# Patient Record
Sex: Male | Born: 1955 | Hispanic: Yes | Marital: Married | Smoking: Never smoker
Health system: Southern US, Community
[De-identification: ages and names within clinical notes are randomized; demographics above are authoritative.]

## PROBLEM LIST (undated history)

## (undated) DIAGNOSIS — Z87442 Personal history of urinary calculi: Secondary | ICD-10-CM

## (undated) DIAGNOSIS — I10 Essential (primary) hypertension: Secondary | ICD-10-CM

## (undated) DIAGNOSIS — Z8489 Family history of other specified conditions: Secondary | ICD-10-CM

## (undated) DIAGNOSIS — R7303 Prediabetes: Secondary | ICD-10-CM

---

## 2013-10-31 ENCOUNTER — Emergency Department: Payer: Self-pay | Admitting: Emergency Medicine

## 2013-10-31 LAB — COMPREHENSIVE METABOLIC PANEL
ALBUMIN: 4.1 g/dL (ref 3.4–5.0)
ALK PHOS: 69 U/L
Anion Gap: 4 — ABNORMAL LOW (ref 7–16)
BUN: 19 mg/dL — AB (ref 7–18)
Bilirubin,Total: 0.7 mg/dL (ref 0.2–1.0)
CHLORIDE: 107 mmol/L (ref 98–107)
Calcium, Total: 8.7 mg/dL (ref 8.5–10.1)
Co2: 26 mmol/L (ref 21–32)
Creatinine: 1.17 mg/dL (ref 0.60–1.30)
EGFR (African American): >60
Glucose: 139 mg/dL — ABNORMAL HIGH (ref 65–99)
Osmolality: 278 (ref 275–301)
Potassium: 4 mmol/L (ref 3.5–5.1)
SGOT(AST): 40 U/L — ABNORMAL HIGH (ref 15–37)
SGPT (ALT): 51 U/L (ref 12–78)
Sodium: 137 mmol/L (ref 136–145)
TOTAL PROTEIN: 7.9 g/dL (ref 6.4–8.2)

## 2013-10-31 LAB — URINALYSIS, COMPLETE
BACTERIA: NONE SEEN
BILIRUBIN, UR: NEGATIVE
BLOOD: NEGATIVE
Glucose,UR: NEGATIVE mg/dL (ref 0–75)
Ketone: NEGATIVE
Leukocyte Esterase: NEGATIVE
Nitrite: NEGATIVE
Ph: 5 (ref 4.5–8.0)
Protein: 30
RBC,UR: 5 /HPF (ref 0–5)
SPECIFIC GRAVITY: 1.029 (ref 1.003–1.030)
WBC UR: 6 /HPF (ref 0–5)

## 2013-10-31 LAB — CBC WITH DIFFERENTIAL/PLATELET
BASOS PCT: 2.1 %
Basophil #: 0.2 10*3/uL — ABNORMAL HIGH (ref 0.0–0.1)
EOS PCT: 0.4 %
Eosinophil #: 0 10*3/uL (ref 0.0–0.7)
HCT: 47 % (ref 40.0–52.0)
HGB: 15.1 g/dL (ref 13.0–18.0)
LYMPHS ABS: 1.9 10*3/uL (ref 1.0–3.6)
Lymphocyte %: 18.2 %
MCH: 29.1 pg (ref 26.0–34.0)
MCHC: 32.2 g/dL (ref 32.0–36.0)
MCV: 90 fL (ref 80–100)
Monocyte #: 0.4 x10 3/mm (ref 0.2–1.0)
Monocyte %: 3.4 %
NEUTROS PCT: 75.9 %
Neutrophil #: 7.8 10*3/uL — ABNORMAL HIGH (ref 1.4–6.5)
Platelet: 199 10*3/uL (ref 150–440)
RBC: 5.2 10*6/uL (ref 4.40–5.90)
RDW: 13.2 % (ref 11.5–14.5)
WBC: 10.3 10*3/uL (ref 3.8–10.6)

## 2017-04-03 ENCOUNTER — Ambulatory Visit: Payer: Self-pay | Admitting: General Surgery

## 2017-04-03 NOTE — H&P (Signed)
PATIENT PROFILE: Louis Reyes is a 61 y.o. male who presents to the Clinic for consultation at the request of Dr. Netty Starring for evaluation of unilateral inguinal hernia.  PCP:  Dion Body, MD  HISTORY OF PRESENT ILLNESS: Mr. Louis Reyes reports feeling the inguinal hernia since 2 years ago. Patient refers did not bother that much until the last few months. Patient refers it has increased in size. Refers standing for prolonged hours at work and doing heavy lifting make the pain worse. Advil does not improve pain much. Denies abdominal distention. Refers having regular bowel movement without the need of strain. Denies nausea or vomiting.    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) - diet controlled 08/25/2016      GENERAL REVIEW OF SYSTEMS:   General ROS: negative for - chills, fatigue, fever, weight gain or weight loss Allergy and Immunology ROS: negative for - hives  Hematological and Lymphatic ROS: negative for - bleeding problems or bruising, negative for palpable nodes Endocrine ROS: negative for - heat or cold intolerance, hair changes Respiratory ROS: negative for - cough, shortness of breath or wheezing Cardiovascular ROS: no chest pain or palpitations GI ROS: negative for nausea, vomiting, abdominal pain, diarrhea, constipation Musculoskeletal ROS: negative for - joint swelling or muscle pain Neurological ROS: negative for - confusion, syncope Dermatological ROS: negative for pruritus and rash  MEDICATIONS: CurrentMedications        Current Outpatient Prescriptions  Medication Sig Dispense Refill  . losartan (COZAAR) 50 MG tablet Take 1 tablet (50 mg total) by mouth once daily. 30 tablet 3   No current facility-administered medications for this visit.       ALLERGIES: Patient has no  known allergies.  PAST MEDICAL HISTORY: History reviewed. No pertinent past medical history.  PAST SURGICAL HISTORY: History reviewed. No pertinent surgical history.   FAMILY HISTORY:      Family History  Problem Relation Age of Onset  . No Known Problems Mother   . No Known Problems Father   . Diabetes type II Sister      SOCIAL HISTORY: Social History        Social History  . Marital status: Married    Spouse name: N/A  . Number of children: N/A  . Years of education: N/A       Social History Main Topics  . Smoking status: Never Smoker  . Smokeless tobacco: Never Used  . Alcohol use No  . Drug use: No  . Sexual activity: Defer       Other Topics Concern  . 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 Weight: 75.8 kg (167 lb)   General Appearance:    Alert, cooperative, no distress, appears stated age  Head:     Atraumatic, normocephalic  Eyes:   Anciteric, no erythema, no secretions  Neck:   Supple, symmetrical, no JVD, no palpable lymph nodes  Mouth:   Lips, mucosa, and  tongue normal;   Lungs:     Clear to auscultation bilaterally, respirations unlabored   Heart:    Regular rate and rhythm, S1 and S2 normal, no murmur, rub   or gallop  Abdomen:     Soft, non-tender, bowel sounds active all four quadrants,    no masses, no organomegaly. Right inguinal hernia, reducible. Mild tender to palpation. No abdominal distention.   Extremities:   Extremities normal, atraumatic, no cyanosis or edema  Skin:   Skin color, texture, turgor normal, no rashes or lesions   Neurologic:   Grossly intact.    REVIEW OF DATA: I have reviewed the following data today:      Appointment on  04/01/2017  Component Date Value  . Hemoglobin A1C 04/01/2017 5.9*  . Average Blood Glucose (C* 04/01/2017 123      ASSESSMENT: Mr. Louis Reyes is a 61 y.o. male presenting for consultation for inguinal hernia. Patient with right inguinal hernia that is easily reducible on physical exam. Patient currently started on BP medication due to HTN. At this visit still uncontrolled.  We oriented patient about the diagnosis of inguinal hernia, what does it implicate, how to manage it, treatment alternatives (observaton vs surgery). Patient elected surgical management which I consider will benefit this patient with symptoms of pain and to reduce the risk of incarceration and/or obstruction. Patient will need to see PCP again for medical clearance and better BP control for surgery.    PLAN: 1. Return to Primary Physician for medical clearance as BP still elevated 2. Avoid heavy lifting 3. Inguinal Hernia Repair with mesh CPT: 37106 4. Follow Clinic Staff instructions for pre admission process 5. CBC, CMP, EKG, Internal Medicine clearance.   Patient and wife verbalized understanding, all questions were answered, and were agreeable with the plan outlined above.   Herbert Pun, MD  Electronically signed by Herbert Pun, MD

## 2017-04-12 ENCOUNTER — Ambulatory Visit: Payer: Self-pay | Admitting: General Surgery

## 2017-04-12 NOTE — H&P (Signed)
PATIENT PROFILE: Markese Bloxham is a 61 y.o. male who presents to the Clinic for consultation at the request of Dr. Netty Starring for evaluation of unilateral inguinal hernia.  PCP:  Dion Body, MD  HISTORY OF PRESENT ILLNESS: [name] reports feeling the inguinal hernia since 2 years ago. Patient refers did not bother that much until the last few months. Patient refers it has increased in size. Refers standing for prolonged hours at work and doing heavy lifting make the pain worse. Advil does not improve pain much. Denies abdominal distention. Refers having regular bowel movement without the need of strain. Denies nausea or vomiting.    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) - diet controlled 08/25/2016      GENERAL REVIEW OF SYSTEMS:   General ROS: negative for - chills, fatigue, fever, weight gain or weight loss Allergy and Immunology ROS: negative for - hives  Hematological and Lymphatic ROS: negative for - bleeding problems or bruising, negative for palpable nodes Endocrine ROS: negative for - heat or cold intolerance, hair changes Respiratory ROS: negative for - cough, shortness of breath or wheezing Cardiovascular ROS: no chest pain or palpitations GI ROS: negative for nausea, vomiting, abdominal pain, diarrhea, constipation Musculoskeletal ROS: negative for - joint swelling or muscle pain Neurological ROS: negative for - confusion, syncope Dermatological ROS: negative for pruritus and rash  MEDICATIONS: CurrentMedications        Current Outpatient Prescriptions  Medication Sig Dispense Refill  . losartan (COZAAR) 50 MG tablet Take 1 tablet (50 mg total) by mouth once daily. 30 tablet 3   No current facility-administered medications for this visit.       ALLERGIES: Patient has no  known allergies.  PAST MEDICAL HISTORY: History reviewed. No pertinent past medical history.  PAST SURGICAL HISTORY: History reviewed. No pertinent surgical history.   FAMILY HISTORY:      Family History  Problem Relation Age of Onset  . No Known Problems Mother   . No Known Problems Father   . Diabetes type II Sister      SOCIAL HISTORY: Social History        Social History  . Marital status: Married    Spouse name: N/A  . Number of children: N/A  . Years of education: N/A       Social History Main Topics  . Smoking status: Never Smoker  . Smokeless tobacco: Never Used  . Alcohol use No  . Drug use: No  . Sexual activity: Defer       Other Topics Concern  . 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 Weight: 75.8 kg (167 lb)   General Appearance:    Alert, cooperative, no distress, appears stated age  Head:     Atraumatic, normocephalic  Eyes:   Anciteric, no erythema, no secretions  Neck:   Supple, symmetrical, no JVD, no palpable lymph nodes  Mouth:  Lips, mucosa, and tongue normal;   Lungs:     Clear to auscultation bilaterally, respirations unlabored   Heart:    Regular rate and rhythm, S1 and S2 normal, no murmur, rub   or gallop  Abdomen:     Soft, non-tender, bowel sounds active all four quadrants,    no masses, no organomegaly. Right inguinal hernia, reducible. Mild tender to palpation. No abdominal distention.   Extremities:   Extremities normal, atraumatic, no cyanosis or edema  Skin:   Skin color, texture, turgor normal, no rashes or lesions   Neurologic:   Grossly intact.    REVIEW OF DATA: I have reviewed the following data today:      Appointment on  04/01/2017  Component Date Value  . Hemoglobin A1C 04/01/2017 5.9*  . Average Blood Glucose (C* 04/01/2017 123      ASSESSMENT: [name] is a 61 y.o. male presenting for consultation for inguinal hernia. Patient with right inguinal hernia that is easily reducible on physical exam. Patient currently started on BP medication due to HTN. At this visit still uncontrolled.  We oriented patient about the diagnosis of inguinal hernia, what does it implicate, how to manage it, treatment alternatives (observaton vs surgery). Patient elected surgical management which I consider will benefit this patient with symptoms of pain and to reduce the risk of incarceration and/or obstruction. Patient will need to see PCP again for medical clearance and better BP control for surgery.    PLAN: 1. Return to Primary Physician for medical clearance as BP still elevated 2. Avoid heavy lifting 3. Inguinal Hernia Repair with mesh CPT: 30865 4. Follow Clinic Staff instructions for pre admission process 5. CBC, CMP, EKG, Internal Medicine clearance.   Patient and wife verbalized understanding, all questions were answered, and were agreeable with the plan outlined above.   Herbert Pun, MD  Electronically signed by Herbert Pun, MD

## 2017-04-19 ENCOUNTER — Inpatient Hospital Stay: Admission: RE | Admit: 2017-04-19 | Payer: Self-pay | Source: Ambulatory Visit

## 2017-04-25 ENCOUNTER — Encounter
Admission: RE | Admit: 2017-04-25 | Discharge: 2017-04-25 | Disposition: A | Discharge status: Home / Self Care | Payer: BLUE CROSS/BLUE SHIELD | Source: Ambulatory Visit | Attending: General Surgery | Admitting: General Surgery

## 2017-04-25 ENCOUNTER — Ambulatory Visit: Payer: Self-pay | Admitting: General Surgery

## 2017-04-25 DIAGNOSIS — Z01818 Encounter for other preprocedural examination: Secondary | ICD-10-CM | POA: Diagnosis not present

## 2017-04-25 DIAGNOSIS — I10 Essential (primary) hypertension: Secondary | ICD-10-CM | POA: Diagnosis not present

## 2017-04-25 DIAGNOSIS — K4090 Unilateral inguinal hernia, without obstruction or gangrene, not specified as recurrent: Secondary | ICD-10-CM | POA: Insufficient documentation

## 2017-04-25 HISTORY — DX: Essential (primary) hypertension: I10

## 2017-04-25 HISTORY — DX: Family history of other specified conditions: Z84.89

## 2017-04-25 HISTORY — DX: Personal history of urinary calculi: Z87.442

## 2017-04-25 NOTE — Patient Instructions (Signed)
Your procedure is scheduled on: Monday Nov. 5, 2018 Su procedimiento est programado para: Report to Day Surgery. [facility] 2nd Floor Surgery Desk  Presntese a: To find out your arrival time please call [phone] between 1PM - 3PM on Friday Nov.2, 2018. Para saber su hora de llegada por favor llame al [phone] Louis Reyes la 1PM - 3PM el da: Call interpreter  Office Friday Nov 2. [phone]  Remember: Instructions that are not followed completely may result in serious medical risk, up to and including death, or upon the discretion of your surgeon and anesthesiologist your surgery may need to be rescheduled.  Recuerde: Las instrucciones que no se siguen completamente Heritage manager en un riesgo de salud grave, incluyendo hasta la [place] o a discrecin de su cirujano y Environmental health practitioner, su ciruga se puede posponer.   __X_ 1.Do not eat food after midnight the night before your procedure. No gum chewing or hard candies. You may drink clear liquids up to 2 hours before you are scheduled to arrive for your surgery- DO not drink clear liquids within 2 hours of the start of your surgery.  Clear Liquids include: water, apple juice without pulp, clear carbohydrate drink such as Clearfast of [place], Black Coffee or Tea (Do not add anything to coffee or tea).      No coma nada despus de la medianoche de la noche anterior a suprocedimiento. No coma chicles ni caramelos duros. Puede tomar lquidos claros hasta 2 horas antes de su hora programada de llegada al hospital para su procedimiento. No tome lquidos claros durante el transcurso de las 2 horas de su llegada programada al hospital para su procedimiento, ya que esto puede llevar a que su procedimiento seretrase o tenga que volver a Health and safety inspector.  Los lquidos claros incluyen:         - Agua o jugo de [place] sin pulpa         - Bebidas claras con carbohidratos como ClearFast o Gatorade         - Caf negro o t claro (sin leche, sin cremas, no  agregue nada al caf ni al  t)  No tome nada que no est en esta lista.  Los pacientes con diabetes tipo 1 y tipo 2 solo deben Agricultural engineer.  Llame a la clnica de PreCare o a la unidad de Same Day Surgery si  tiene alguna pregunta sobre estas instrucciones.              _X__ 2.Do Not Smoke or use e-cigarettes For 24 Hours Prior to Your  Surgery.  Do not use any chewable tobacco products for at least 6 hours prior to surgery. No fume ni use cigarrillos electrnicos durante las 24 horas previas a su [place].  No use ningn producto de tabaco masticable duranteal menos 6 horas antes de la [place].     __X_ 3. No alcohol for 24 hours before or after surgery.    No tome alcohol durante las 24 horas antes ni despus de la [place].   ____4. Bring all medications with you on the day of surgery if instructed.    Lleve todos los medicamentos con usted el da de su ciruga si se le   ha indicado as.   _X___ 5. Notify your doctor if there is any change in your medical condition (cold, fever, infections).    Informe a su mdico si hay algn cambio en su condicin mdica (resfriado, fiebre, infecciones).   Do not wear jewelry,  make-up, hairpins, clips or nail polish.  No use joyas, maquillajes, pinzas/ganchos para el cabello ni esmalte de uas.  Do not wear lotions, powders, or perfumes.   No use lociones, polvos o perfumes. .    Do not shave 48 hours prior to surgery. Men may shave face and neck.  No se afeite 48 horas antes de la [place].  Los hombres pueden [employer] cara  y el cuello.   Do not bring valuables to the hospital.   No lleve objetos [facility] is not responsible for any belongings or valuables.  [facility] no se hace responsable de ningn tipo de pertenencias uobjetos de Geographical information systems officer.               Contacts, dentures or bridgework may not be worn into surgery.  Los lentes de [place], las dentaduras postizas o puentes no se pueden usar en la [place].  Leave your  suitcase in the car. After surgery it may be brought to your room.  Deje su maleta en el auto.  Despus de la ciruga podr traerla a su habitacin.  For patients admitted to the hospital, discharge time is determined by your treatment team.  Para los pacientes que sean ingresados al hospital, el tiempo en el cual se le dar de alta es determinado por su equipo de [place].   Patients discharged the day of surgery will not be allowed to drive home. A los pacientes que se les da de alta el mismo da de la ciruga no se les permitir conducir a Holiday representative.   Please read over the following fact sheets that you were given: Por favor [place] informacin que le dieron:      ____ Take these medicines the morning of surgery with A SIP OF WATER:          [employer] estas medicinas la maana de la ciruga con [place]:  1.   2.   3.               ____ Fleet Enema (as directed)          Enema de Fleet (segn lo indicado)    ___X_ Use CHG Soap as directed          Utilice el jabn de CHG segn lo indicado  ____ Use inhalers on the day of surgery          Use los inhaladores el da de la ciruga  ____ Stop metformin 2 days prior to surgery          Deje de tomar el metformin 2 das antes de la ciruga    ____ Take 1/2 of usual insulin dose the night before surgery and none on the morning of surgery           Tome la mitad de la dosis habitual de insulina la noche antes de la [place] y no tome nada en la maana de la             ciruga  ____ Stop Coumadin/Plavix/aspirin on ( NO ASPIRIN or ASPIRIN PRODUCTS)          Deje de tomar el Coumadin/Plavix/aspirina el da:  ____ Stop Anti-inflammatories on ( NO ASPIRIN PRODUCTS)          Deje de tomar antiinflamatorios el da:   ____ Stop supplements until after surgery            Deje de tomar suplementos  hasta despus de la ciruga  ____ Bring C-Pap to the hospital          [place] al hospital

## 2017-04-25 NOTE — H&P (View-Only) (Signed)
PATIENT PROFILE: Fabricio Endsley is a 61 y.o. male who presents to the Clinic for consultation at the request of Dr. Netty Starring for evaluation of unilateral inguinal hernia.  PCP:  Dion Body, MD  HISTORY OF PRESENT ILLNESS: [name] reports feeling the inguinal hernia since 2 years ago. Patient refers did not bother that much until the last few months. Patient refers it has increased in size. Refers standing for prolonged hours at work and doing heavy lifting make the pain worse. Advil does not improve pain much. Denies abdominal distention. Refers having regular bowel movement without the need of strain. Denies nausea or vomiting.    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) - diet controlled 08/25/2016      GENERAL REVIEW OF SYSTEMS:   General ROS: negative for - chills, fatigue, fever, weight gain or weight loss Allergy and Immunology ROS: negative for - hives  Hematological and Lymphatic ROS: negative for - bleeding problems or bruising, negative for palpable nodes Endocrine ROS: negative for - heat or cold intolerance, hair changes Respiratory ROS: negative for - cough, shortness of breath or wheezing Cardiovascular ROS: no chest pain or palpitations GI ROS: negative for nausea, vomiting, abdominal pain, diarrhea, constipation Musculoskeletal ROS: negative for - joint swelling or muscle pain Neurological ROS: negative for - confusion, syncope Dermatological ROS: negative for pruritus and rash  MEDICATIONS: CurrentMedications        Current Outpatient Prescriptions  Medication Sig Dispense Refill  . losartan (COZAAR) 50 MG tablet Take 1 tablet (50 mg total) by mouth once daily. 30 tablet 3   No current facility-administered medications for this visit.       ALLERGIES: Patient has no  known allergies.  PAST MEDICAL HISTORY: History reviewed. No pertinent past medical history.  PAST SURGICAL HISTORY: History reviewed. No pertinent surgical history.   FAMILY HISTORY:      Family History  Problem Relation Age of Onset  . No Known Problems Mother   . No Known Problems Father   . Diabetes type II Sister      SOCIAL HISTORY: Social History        Social History  . Marital status: Married    Spouse name: N/A  . Number of children: N/A  . Years of education: N/A       Social History Main Topics  . Smoking status: Never Smoker  . Smokeless tobacco: Never Used  . Alcohol use No  . Drug use: No  . Sexual activity: Defer       Other Topics Concern  . 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 Weight: 75.8 kg (167 lb)   General Appearance:    Alert, cooperative, no distress, appears stated age  Head:     Atraumatic, normocephalic  Eyes:   Anciteric, no erythema, no secretions  Neck:   Supple, symmetrical, no JVD, no palpable lymph nodes  Mouth:  Lips, mucosa, and tongue normal;   Lungs:     Clear to auscultation bilaterally, respirations unlabored   Heart:    Regular rate and rhythm, S1 and S2 normal, no murmur, rub   or gallop  Abdomen:     Soft, non-tender, bowel sounds active all four quadrants,    no masses, no organomegaly. Right inguinal hernia, reducible. Mild tender to palpation. No abdominal distention.   Extremities:   Extremities normal, atraumatic, no cyanosis or edema  Skin:   Skin color, texture, turgor normal, no rashes or lesions   Neurologic:   Grossly intact.    REVIEW OF DATA: I have reviewed the following data today:      Appointment on  04/01/2017  Component Date Value  . Hemoglobin A1C 04/01/2017 5.9*  . Average Blood Glucose (C* 04/01/2017 123      ASSESSMENT: [name] is a 61 y.o. male presenting for consultation for inguinal hernia. Patient with right inguinal hernia that is easily reducible on physical exam. Patient was oriented about the diagnosis of inguinal hernia, what does it implicate, how to manage it, treatment alternatives (observaton vs surgery). Patient elected surgical management which I consider will benefit this patient with symptoms of pain and to reduce the risk of incarceration and/or obstruction.    PLAN: 1. Avoid heavy lifting 2. Inguinal Hernia Repair with mesh CPT: 03009 3. Follow Clinic Staff instructions for pre admission process 4. CBC, CMP, EKG, Internal Medicine clearance.   Patient and wife verbalized understanding, all questions were answered, and were agreeable with the plan outlined above.   Herbert Pun, MD  Electronically signed by Herbert Pun, MD

## 2017-04-25 NOTE — H&P (Signed)
PATIENT PROFILE: Louis Reyes is a 61 y.o. male who presents to the Clinic for consultation at the request of Dr. Netty Starring for evaluation of unilateral inguinal hernia.  PCP:  Dion Body, MD  HISTORY OF PRESENT ILLNESS: Mr. Louis Reyes reports feeling the inguinal hernia since 2 years ago. Patient refers did not bother that much until the last few months. Patient refers it has increased in size. Refers standing for prolonged hours at work and doing heavy lifting make the pain worse. Advil does not improve pain much. Denies abdominal distention. Refers having regular bowel movement without the need of strain. Denies nausea or vomiting.    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) - diet controlled 08/25/2016      GENERAL REVIEW OF SYSTEMS:   General ROS: negative for - chills, fatigue, fever, weight gain or weight loss Allergy and Immunology ROS: negative for - hives  Hematological and Lymphatic ROS: negative for - bleeding problems or bruising, negative for palpable nodes Endocrine ROS: negative for - heat or cold intolerance, hair changes Respiratory ROS: negative for - cough, shortness of breath or wheezing Cardiovascular ROS: no chest pain or palpitations GI ROS: negative for nausea, vomiting, abdominal pain, diarrhea, constipation Musculoskeletal ROS: negative for - joint swelling or muscle pain Neurological ROS: negative for - confusion, syncope Dermatological ROS: negative for pruritus and rash  MEDICATIONS: CurrentMedications        Current Outpatient Prescriptions  Medication Sig Dispense Refill  . losartan (COZAAR) 50 MG tablet Take 1 tablet (50 mg total) by mouth once daily. 30 tablet 3   No current facility-administered medications for this visit.       ALLERGIES: Patient has no  known allergies.  PAST MEDICAL HISTORY: History reviewed. No pertinent past medical history.  PAST SURGICAL HISTORY: History reviewed. No pertinent surgical history.   FAMILY HISTORY:      Family History  Problem Relation Age of Onset  . No Known Problems Mother   . No Known Problems Father   . Diabetes type II Sister      SOCIAL HISTORY: Social History        Social History  . Marital status: Married    Spouse name: N/A  . Number of children: N/A  . Years of education: N/A       Social History Main Topics  . Smoking status: Never Smoker  . Smokeless tobacco: Never Used  . Alcohol use No  . Drug use: No  . Sexual activity: Defer       Other Topics Concern  . 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 Weight: 75.8 kg (167 lb)   General Appearance:    Alert, cooperative, no distress, appears stated age  Head:     Atraumatic, normocephalic  Eyes:   Anciteric, no erythema, no secretions  Neck:   Supple, symmetrical, no JVD, no palpable lymph nodes  Mouth:  Lips, mucosa, and tongue normal;   Lungs:     Clear to auscultation bilaterally, respirations unlabored   Heart:    Regular rate and rhythm, S1 and S2 normal, no murmur, rub   or gallop  Abdomen:     Soft, non-tender, bowel sounds active all four quadrants,    no masses, no organomegaly. Right inguinal hernia, reducible. Mild tender to palpation. No abdominal distention.   Extremities:   Extremities normal, atraumatic, no cyanosis or edema  Skin:   Skin color, texture, turgor normal, no rashes or lesions   Neurologic:   Grossly intact.    REVIEW OF DATA: I have reviewed the following data today:      Appointment on  04/01/2017  Component Date Value  . Hemoglobin A1C 04/01/2017 5.9*  . Average Blood Glucose (C* 04/01/2017 123      ASSESSMENT: Mr. Louis Reyes is a 61 y.o. male presenting for consultation for inguinal hernia. Patient with right inguinal hernia that is easily reducible on physical exam. Patient was oriented about the diagnosis of inguinal hernia, what does it implicate, how to manage it, treatment alternatives (observaton vs surgery). Patient elected surgical management which I consider will benefit this patient with symptoms of pain and to reduce the risk of incarceration and/or obstruction.    PLAN: 1. Avoid heavy lifting 2. Inguinal Hernia Repair with mesh CPT: 50539 3. Follow Clinic Staff instructions for pre admission process 4. CBC, CMP, EKG, Internal Medicine clearance.   Patient and wife verbalized understanding, all questions were answered, and were agreeable with the plan outlined above.   Herbert Pun, MD  Electronically signed by Herbert Pun, MD

## 2017-04-28 MED ORDER — CEFAZOLIN SODIUM-DEXTROSE 2-4 GM/100ML-% IV SOLN
2.0000 g | INTRAVENOUS | Status: AC
  Administered 2017-04-29: 2 g via INTRAVENOUS

## 2017-04-29 ENCOUNTER — Ambulatory Visit: Payer: BLUE CROSS/BLUE SHIELD | Admitting: Certified Registered Nurse Anesthetist

## 2017-04-29 ENCOUNTER — Encounter: Payer: Self-pay | Admitting: Anesthesiology

## 2017-04-29 ENCOUNTER — Other Ambulatory Visit: Payer: Self-pay

## 2017-04-29 ENCOUNTER — Encounter
Admission: RE | Disposition: A | Discharge status: Home / Self Care | Payer: Self-pay | Source: Ambulatory Visit | Attending: General Surgery

## 2017-04-29 ENCOUNTER — Ambulatory Visit
Admission: RE | Admit: 2017-04-29 | Discharge: 2017-04-29 | Disposition: A | Discharge status: Home / Self Care | Payer: BLUE CROSS/BLUE SHIELD | Source: Ambulatory Visit | Attending: General Surgery | Admitting: General Surgery

## 2017-04-29 DIAGNOSIS — K4090 Unilateral inguinal hernia, without obstruction or gangrene, not specified as recurrent: Secondary | ICD-10-CM | POA: Diagnosis present

## 2017-04-29 DIAGNOSIS — I10 Essential (primary) hypertension: Secondary | ICD-10-CM | POA: Diagnosis not present

## 2017-04-29 DIAGNOSIS — D176 Benign lipomatous neoplasm of spermatic cord: Secondary | ICD-10-CM | POA: Insufficient documentation

## 2017-04-29 DIAGNOSIS — K4030 Unilateral inguinal hernia, with obstruction, without gangrene, not specified as recurrent: Secondary | ICD-10-CM | POA: Diagnosis not present

## 2017-04-29 HISTORY — PX: HERNIA REPAIR: SHX51

## 2017-04-29 HISTORY — PX: INGUINAL HERNIA REPAIR: SHX194

## 2017-04-29 SURGERY — REPAIR, HERNIA, INGUINAL, ADULT
Anesthesia: General

## 2017-04-29 MED ORDER — HYDROCODONE-ACETAMINOPHEN 7.5-325 MG PO TABS: 1.0000 | ORAL_TABLET | Freq: Four times a day (QID) | ORAL | Status: DC | PRN

## 2017-04-29 MED ORDER — BUPIVACAINE-EPINEPHRINE (PF) 0.25% -1:200000 IJ SOLN
INTRAMUSCULAR | Status: AC
Start: 2017-04-29 — End: ?
  Filled 2017-04-29: qty 20

## 2017-04-29 MED ORDER — FENTANYL CITRATE (PF) 100 MCG/2ML IJ SOLN
INTRAMUSCULAR | Status: AC
  Filled 2017-04-29: qty 2

## 2017-04-29 MED ORDER — PROPOFOL 10 MG/ML IV BOLUS
INTRAVENOUS | Status: AC
  Filled 2017-04-29: qty 20

## 2017-04-29 MED ORDER — FENTANYL CITRATE (PF) 100 MCG/2ML IJ SOLN
INTRAMUSCULAR | Status: DC | PRN
  Administered 2017-04-29 (×4): 50 ug via INTRAVENOUS

## 2017-04-29 MED ORDER — ONDANSETRON HCL 4 MG/2ML IJ SOLN: 4.0000 mg | Freq: Once | INTRAMUSCULAR | Status: DC | PRN

## 2017-04-29 MED ORDER — SUGAMMADEX SODIUM 200 MG/2ML IV SOLN
INTRAVENOUS | Status: AC
  Filled 2017-04-29: qty 2

## 2017-04-29 MED ORDER — CEFAZOLIN SODIUM-DEXTROSE 2-4 GM/100ML-% IV SOLN
INTRAVENOUS | Status: AC
  Filled 2017-04-29: qty 100

## 2017-04-29 MED ORDER — SUCCINYLCHOLINE CHLORIDE 20 MG/ML IJ SOLN
INTRAMUSCULAR | Status: AC
  Filled 2017-04-29: qty 1

## 2017-04-29 MED ORDER — GLYCOPYRROLATE 0.2 MG/ML IJ SOLN
INTRAMUSCULAR | Status: DC | PRN
  Administered 2017-04-29: 0.2 mg via INTRAVENOUS

## 2017-04-29 MED ORDER — ROCURONIUM BROMIDE 50 MG/5ML IV SOLN
INTRAVENOUS | Status: AC
  Filled 2017-04-29: qty 1

## 2017-04-29 MED ORDER — DEXAMETHASONE SODIUM PHOSPHATE 10 MG/ML IJ SOLN
INTRAMUSCULAR | Status: AC
  Filled 2017-04-29: qty 1

## 2017-04-29 MED ORDER — ONDANSETRON HCL 4 MG/2ML IJ SOLN
INTRAMUSCULAR | Status: AC
  Filled 2017-04-29: qty 2

## 2017-04-29 MED ORDER — FAMOTIDINE 20 MG PO TABS
ORAL_TABLET | ORAL | Status: AC
  Administered 2017-04-29: 20 mg via ORAL
  Filled 2017-04-29: qty 1

## 2017-04-29 MED ORDER — FAMOTIDINE 20 MG PO TABS
20.0000 mg | ORAL_TABLET | Freq: Once | ORAL | Status: AC
  Administered 2017-04-29: 20 mg via ORAL

## 2017-04-29 MED ORDER — LACTATED RINGERS IV SOLN
INTRAVENOUS | Status: DC
  Administered 2017-04-29: 11:00:00 via INTRAVENOUS

## 2017-04-29 MED ORDER — SUGAMMADEX SODIUM 200 MG/2ML IV SOLN
INTRAVENOUS | Status: DC | PRN
  Administered 2017-04-29: 150 mg via INTRAVENOUS

## 2017-04-29 MED ORDER — ACETAMINOPHEN 10 MG/ML IV SOLN
INTRAVENOUS | Status: AC
  Filled 2017-04-29: qty 100

## 2017-04-29 MED ORDER — PROPOFOL 10 MG/ML IV BOLUS
INTRAVENOUS | Status: DC | PRN
  Administered 2017-04-29: 150 mg via INTRAVENOUS
  Administered 2017-04-29: 30 mg via INTRAVENOUS
  Administered 2017-04-29: 20 mg via INTRAVENOUS

## 2017-04-29 MED ORDER — FENTANYL CITRATE (PF) 100 MCG/2ML IJ SOLN
25.0000 ug | INTRAMUSCULAR | Status: DC | PRN
  Administered 2017-04-29 (×4): 25 ug via INTRAVENOUS

## 2017-04-29 MED ORDER — DEXAMETHASONE SODIUM PHOSPHATE 10 MG/ML IJ SOLN
INTRAMUSCULAR | Status: DC | PRN
  Administered 2017-04-29: 10 mg via INTRAVENOUS

## 2017-04-29 MED ORDER — MIDAZOLAM HCL 2 MG/2ML IJ SOLN
INTRAMUSCULAR | Status: DC | PRN
  Administered 2017-04-29: 2 mg via INTRAVENOUS

## 2017-04-29 MED ORDER — FENTANYL CITRATE (PF) 100 MCG/2ML IJ SOLN
INTRAMUSCULAR | Status: AC
  Administered 2017-04-29: 25 ug via INTRAVENOUS
  Filled 2017-04-29: qty 2

## 2017-04-29 MED ORDER — SUCCINYLCHOLINE CHLORIDE 20 MG/ML IJ SOLN
INTRAMUSCULAR | Status: DC | PRN
  Administered 2017-04-29: 100 mg via INTRAVENOUS

## 2017-04-29 MED ORDER — PHENYLEPHRINE HCL 10 MG/ML IJ SOLN
INTRAMUSCULAR | Status: DC | PRN
  Administered 2017-04-29 (×2): 200 ug via INTRAVENOUS
  Administered 2017-04-29: 100 ug via INTRAVENOUS
  Administered 2017-04-29: 200 ug via INTRAVENOUS
  Administered 2017-04-29: 100 ug via INTRAVENOUS
  Administered 2017-04-29 (×2): 200 ug via INTRAVENOUS
  Administered 2017-04-29: 100 ug via INTRAVENOUS

## 2017-04-29 MED ORDER — KETOROLAC TROMETHAMINE 30 MG/ML IJ SOLN
INTRAMUSCULAR | Status: DC | PRN
  Administered 2017-04-29: 30 mg via INTRAVENOUS

## 2017-04-29 MED ORDER — ONDANSETRON HCL 4 MG/2ML IJ SOLN
INTRAMUSCULAR | Status: DC | PRN
  Administered 2017-04-29: 4 mg via INTRAVENOUS

## 2017-04-29 MED ORDER — MIDAZOLAM HCL 2 MG/2ML IJ SOLN
INTRAMUSCULAR | Status: AC
  Filled 2017-04-29: qty 2

## 2017-04-29 MED ORDER — ACETAMINOPHEN 10 MG/ML IV SOLN
INTRAVENOUS | Status: DC | PRN
  Administered 2017-04-29: 1000 mg via INTRAVENOUS

## 2017-04-29 MED ORDER — LIDOCAINE HCL (PF) 2 % IJ SOLN
INTRAMUSCULAR | Status: AC
  Filled 2017-04-29: qty 10

## 2017-04-29 MED ORDER — KETOROLAC TROMETHAMINE 30 MG/ML IJ SOLN
INTRAMUSCULAR | Status: AC
  Filled 2017-04-29: qty 1

## 2017-04-29 MED ORDER — BUPIVACAINE-EPINEPHRINE (PF) 0.25% -1:200000 IJ SOLN
INTRAMUSCULAR | Status: AC
Start: 2017-04-29 — End: ?
  Filled 2017-04-29: qty 10

## 2017-04-29 MED ORDER — ROCURONIUM BROMIDE 100 MG/10ML IV SOLN
INTRAVENOUS | Status: DC | PRN
  Administered 2017-04-29 (×3): 20 mg via INTRAVENOUS
  Administered 2017-04-29: 10 mg via INTRAVENOUS

## 2017-04-29 MED ORDER — BUPIVACAINE-EPINEPHRINE 0.25% -1:200000 IJ SOLN
INTRAMUSCULAR | Status: DC | PRN
  Administered 2017-04-29: 10 mL

## 2017-04-29 MED ORDER — TRAMADOL HCL 50 MG PO TABS: 50.0000 mg | ORAL_TABLET | Freq: Four times a day (QID) | ORAL | 0 refills | Status: AC | PRN

## 2017-04-29 SURGICAL SUPPLY — 38 items
ADHESIVE MASTISOL STRL (MISCELLANEOUS) IMPLANT
BLADE SURG 15 STRL LF DISP TIS (BLADE) ×1 IMPLANT
BLADE SURG 15 STRL SS (BLADE) ×2
CANISTER SUCT 1200ML W/VALVE (MISCELLANEOUS) ×3 IMPLANT
CHLORAPREP W/TINT 26ML (MISCELLANEOUS) ×3 IMPLANT
CLOSURE WOUND 1/2 X4 (GAUZE/BANDAGES/DRESSINGS)
DERMABOND ADVANCED (GAUZE/BANDAGES/DRESSINGS) ×2
DERMABOND ADVANCED .7 DNX12 (GAUZE/BANDAGES/DRESSINGS) ×1 IMPLANT
DRAIN PENROSE 1/4X12 LTX (DRAIN) ×3 IMPLANT
DRAPE LAPAROTOMY 100X77 ABD (DRAPES) ×3 IMPLANT
DRSG TELFA 3X8 NADH (GAUZE/BANDAGES/DRESSINGS) IMPLANT
ELECT REM PT RETURN 9FT ADLT (ELECTROSURGICAL) ×3
ELECTRODE REM PT RTRN 9FT ADLT (ELECTROSURGICAL) ×1 IMPLANT
GAUZE SPONGE 4X4 12PLY STRL (GAUZE/BANDAGES/DRESSINGS) IMPLANT
GLOVE BIO SURGEON STRL SZ8 (GLOVE) ×6 IMPLANT
GOWN STRL REUS W/ TWL LRG LVL3 (GOWN DISPOSABLE) ×2 IMPLANT
GOWN STRL REUS W/TWL LRG LVL3 (GOWN DISPOSABLE) ×4
LABEL OR SOLS (LABEL) IMPLANT
MESH HERNIA 4.5X10 SYS PRO LRG (Mesh General) ×1 IMPLANT
MESH HERNIA SYS PROLENE LG (Mesh General) ×2 IMPLANT
NDL SAFETY 22GX1.5 (NEEDLE) ×3 IMPLANT
NS IRRIG 500ML POUR BTL (IV SOLUTION) ×3 IMPLANT
PACK BASIN MINOR ARMC (MISCELLANEOUS) ×3 IMPLANT
SPONGE LAP 18X18 5 PK (GAUZE/BANDAGES/DRESSINGS) IMPLANT
STRIP CLOSURE SKIN 1/2X4 (GAUZE/BANDAGES/DRESSINGS) IMPLANT
SUT ETHIBOND 0 (SUTURE) ×6 IMPLANT
SUT ETHIBOND CT1 BRD #0 30IN (SUTURE) IMPLANT
SUT ETHIBOND NAB CT1 #1 30IN (SUTURE) IMPLANT
SUT MNCRL 4-0 (SUTURE) ×2
SUT MNCRL 4-0 27XMFL (SUTURE) ×1
SUT PROLENE 0 CT 1 30 (SUTURE) ×6 IMPLANT
SUT PROLENE 1 CT 1 30 (SUTURE) ×12 IMPLANT
SUT SURGILON 0 BLK (SUTURE) ×3 IMPLANT
SUT VIC AB 0 CT1 36 (SUTURE) ×6 IMPLANT
SUT VIC AB 3-0 SH 27 (SUTURE) ×2
SUT VIC AB 3-0 SH 27X BRD (SUTURE) ×1 IMPLANT
SUTURE MNCRL 4-0 27XMF (SUTURE) ×1 IMPLANT
SYRINGE 10CC LL (SYRINGE) ×3 IMPLANT

## 2017-04-29 NOTE — Anesthesia Postprocedure Evaluation (Signed)
Anesthesia Post Note  Patient: Louis Reyes  Procedure(s) Performed: HERNIA REPAIR INGUINAL ADULT (N/A )  Patient location during evaluation: PACU Anesthesia Type: General Level of consciousness: awake and alert Pain management: pain level controlled Vital Signs Assessment: post-procedure vital signs reviewed and stable Respiratory status: spontaneous breathing, nonlabored ventilation, respiratory function stable and patient connected to nasal cannula oxygen Cardiovascular status: blood pressure returned to baseline and stable Postop Assessment: no apparent nausea or vomiting Anesthetic complications: no     Last Vitals:  Vitals:   04/29/17 1509 04/29/17 1547  BP: (!) 165/79 (!) 141/72  Pulse: 91 88  Resp: 14   Temp: 36.4 C   SpO2: 99% 98%    Last Pain:  Vitals:   04/29/17 1547  TempSrc:   PainSc: [place]

## 2017-04-29 NOTE — Transfer of Care (Signed)
Immediate Anesthesia Transfer of Care Note  Patient: Louis Reyes  Procedure(s) Performed: HERNIA REPAIR INGUINAL ADULT (N/A )  Patient Location: PACU  Anesthesia Type:General  Level of Consciousness: sedated  Airway & Oxygen Therapy: Patient Spontanous Breathing and Patient connected to face mask oxygen  Post-op Assessment: Report given to RN and Post -op Vital signs reviewed and stable  Post vital signs: Reviewed and stable  Last Vitals:  Vitals:   04/29/17 1043  BP: (!) 156/76  Pulse: 80  Resp: 18  Temp: (!) 35.7 C  SpO2: 100%    Last Pain:  Vitals:   04/29/17 1043  TempSrc: Tympanic         Complications: No apparent anesthesia complications

## 2017-04-29 NOTE — Op Note (Signed)
Preoperative diagnosis: Right Inguinal Hernia.  Postoperative diagnosis: Right Direct Inguinal Hernia.  Procedure: Right Inguinal hernia repair with mesh  Anesthesia: General  Surgeon: Dr. Windell Moment  Wound Classification: Clean  Indications:  Patient is a 61 y.o. male developed a symptomatic right inguinal hernia. Repair was indicated to avoid complications of incarceration, obstruction and pain, and a prosthetic mesh repair was elected.  Findings: 1. Vas Deferens and cord structures identified and preserved 2. A direct inguinal hernia was identified 3. Prolene Hernia System used for repair 4. Adequate hemostasis achieved  Description of procedure: The patient was taken to the operating room. A time-out was completed verifying correct patient, procedure, site, positioning, and implant(s) and/or special equipment prior to beginning this procedure. spinal anesthesia was induced. The right groin was prepped and draped in the usual sterile fashion. An incision was marked in a natural skin crease and planned to end near the pubic tubercle.  The skin crease incision was made with a knife and deepened through Scarpa's and Camper's fascia with electrocautery until the aponeurosis of the external oblique was encountered. This was cleaned and the external ring was exposed. Hemostasis was achieved in the wound. An incision was made in the midportion of the external oblique aponeurosis in the direction of its fibers. The ilioinguinal nerve was identified and protected throughout the dissection. Flaps of the external oblique were developed cephalad and inferiorly.  The cord was identified. It was gently dissected free at the pubic tubercle and encircled with a Penrose drain. Attention was directed to the anteromedial aspect of the cord, where an direct hernia sac was identified.  The vas and testicular vessels were identified and protected from harm. A cord lipoma was dissected and separated from cord  structures and sent to pathology. The hernia content was abundant pre peritoneal fat from a direct hernia. A finger was passed into the peritoneal cavity and the floor of the inguinal canal assessed and found to be weak. The femoral canal was palpated and no hernia identified. The hernia content was reduced into the abdomen.  Attention then turned to the floor of the canal, which appeared to be grossly weakened without a well-defined defect or sac. The Prolene Hernia System mesh was inserted. Beginning at the pubic tubercle, the mesh was sutured to the inguinal ligament inferiorly and the conjoint tendon superiorly using Surgilon 0 sutures. Care was taken to assure that the mesh was placed in a relaxed fashion to avoid excessive tension and that no neurovascular structures were caught in the repair. Laterally, the tails of the mesh were crossed and the internal ring recreated.  Hemostasis was again checked. The Penrose drain was removed. The external oblique aponeurosis was closed with a running suture of 3-0 Vicryl, taking care not to catch the ilioinguinal nerve in the suture line. Scarpa's fascia was closed with interrupted 3-0 Vicryl.  The skin was closed with a subcuticular stitch of Monocryl 4-0. Dermabond was applied.  The testis was gently pulled down into its anatomic position in the scrotum.  The patient tolerated the procedure well and was taken to the postanesthesia care unit in stable condition.   Specimen: Lipoma  Drains: None  Complications: None  Estimated Blood Loss: 63mL

## 2017-04-29 NOTE — Discharge Instructions (Signed)
°  Diet: Puede retomar una dieta salubable. (Resume home heart healthy regular diet.)  Activity: No levantar objetos pesados >20 libras (nios, mascotas, cajas, bolsa de basura) o hacer ejercisios fuerte. Actividad liviana como caminar es recomendada. No puede manejar ni ingerir bebidas alcoholicas. (No heavy lifting >20 pounds (children, pets, laundry, garbage) or strenuous activity until follow-up, but light activity and walking are encouraged. Do not drive or drink alcohol if taking narcotic pain medications.)  Wound care: Puede baarse en una ducha desde maana. Secarse la herida con cuidado. No sumergir la herida bajo agua en baera, [place]. (May shower with soapy water and pat dry (do not rub incisions), but no baths or submerging incision underwater until follow-up. (no swimming))   Medications: Continue todos los [employer] [place] antes de la [place]. Para dolor leve puede utilizar acetaminofen o ibuprofen. Si el dolor es mas fuerte puede tomar el medicamento recetado. (Resume all home medications. For mild to moderate pain: acetaminophen (Tylenol) or ibuprofen (if no kidney disease). Combining Tylenol with alcohol can substantially increase your risk of causing liver disease. Narcotic pain medications, if prescribed, can be used for severe pain, though may cause nausea, constipation, and drowsiness. Do not combine Tylenol and Percocet within a 6 hour period as Percocet contains Tylenol. If you do not need the narcotic pain medication, you do not need to fill the prescription.)  Call office Favor llamar a la oficina si tiene [place] pregunta, el dolor [place], [place], escalofros, sangrado o la herida comienza a Chief Financial Officer. (Call [phone]) at any time if any questions, worsening pain, fevers/chills, bleeding, drainage from incision site, or other concerns.)

## 2017-04-29 NOTE — Anesthesia Post-op Follow-up Note (Signed)
Anesthesia QCDR form completed.        

## 2017-04-29 NOTE — Interval H&P Note (Signed)
History and Physical Interval Note:  04/29/2017 11:29 AM  Louis Reyes  has presented today for surgery, with the diagnosis of UNILATERAL (Right) INGUINAL HERNIA WITHOUT OBSTRUCTION OR GANGRENE, RECURRENCE NOT SPECIFIED  The various methods of treatment have been discussed with the patient and family. After consideration of risks, benefits and other options for treatment, the patient has consented to  Procedure(s): HERNIA REPAIR INGUINAL ADULT (N/A) (Right) as a surgical intervention .  The patient's history has been reviewed, patient examined, no change in status, stable for surgery.  I have reviewed the patient's chart and labs.  Correct site was marked on the pre procedure area. Questions were answered to the patient's satisfaction.     Herbert Pun

## 2017-04-29 NOTE — Progress Notes (Signed)
Instructions gone over with family and interpretor used  States understanding

## 2017-04-29 NOTE — Anesthesia Preprocedure Evaluation (Signed)
Anesthesia Evaluation  Patient identified by MRN, date of birth, ID band Patient awake    Reviewed: Allergy & Precautions, NPO status , Patient's Chart, lab work & pertinent test results, reviewed documented beta blocker date and time   History of Anesthesia Complications (+) Family history of anesthesia reaction  Airway Mallampati: II  TM Distance: >3 FB     Dental  (+) Chipped   Pulmonary           Cardiovascular hypertension, Pt. on medications      Neuro/Psych    GI/Hepatic   Endo/Other    Renal/GU      Musculoskeletal   Abdominal   Peds  Hematology   Anesthesia Other Findings   Reproductive/Obstetrics                             Anesthesia Physical Anesthesia Plan  ASA: II  Anesthesia Plan:    Post-op Pain Management:    Induction: Intravenous  PONV Risk Score and Plan: 1  Airway Management Planned: LMA  Additional Equipment:   Intra-op Plan:   Post-operative Plan:   Informed Consent: I have reviewed the patients History and Physical, chart, labs and discussed the procedure including the risks, benefits and alternatives for the proposed anesthesia with the patient or authorized representative who has indicated his/her understanding and acceptance.     Plan Discussed with: CRNA  Anesthesia Plan Comments:         Anesthesia Quick Evaluation

## 2017-04-29 NOTE — Anesthesia Procedure Notes (Signed)
Procedure Name: Intubation Date/Time: 04/29/2017 11:43 AM Performed by: Johnna Acosta, CRNA Pre-anesthesia Checklist: Patient identified, Emergency Drugs available, Suction available, Patient being monitored and Timeout performed Patient Re-evaluated:Patient Re-evaluated prior to induction Oxygen Delivery Method: Circle system utilized Preoxygenation: Pre-oxygenation with 100% oxygen Induction Type: IV induction Ventilation: Mask ventilation without difficulty Laryngoscope Size: Miller and 2 Grade View: Grade I Tube type: Oral Tube size: 7.5 mm Number of attempts: 1 Airway Equipment and Method: Stylet Placement Confirmation: ETT inserted through vocal cords under direct vision,  positive ETCO2 and breath sounds checked- equal and bilateral Secured at: 21 cm Tube secured with: Tape Dental Injury: Teeth and Oropharynx as per pre-operative assessment

## 2017-04-30 ENCOUNTER — Encounter: Payer: Self-pay | Admitting: General Surgery

## 2017-05-01 LAB — SURGICAL PATHOLOGY

## 2018-02-11 ENCOUNTER — Encounter: Payer: Self-pay | Admitting: *Deleted

## 2018-02-12 ENCOUNTER — Ambulatory Visit
Admission: RE | Admit: 2018-02-12 | Discharge: 2018-02-12 | Disposition: A | Discharge status: Home / Self Care | Payer: BLUE CROSS/BLUE SHIELD | Source: Ambulatory Visit | Attending: Internal Medicine | Admitting: Internal Medicine

## 2018-02-12 ENCOUNTER — Ambulatory Visit: Payer: BLUE CROSS/BLUE SHIELD | Admitting: Anesthesiology

## 2018-02-12 ENCOUNTER — Encounter: Payer: Self-pay | Admitting: *Deleted

## 2018-02-12 ENCOUNTER — Encounter
Admission: RE | Disposition: A | Discharge status: Home / Self Care | Payer: Self-pay | Source: Ambulatory Visit | Attending: Internal Medicine

## 2018-02-12 DIAGNOSIS — I10 Essential (primary) hypertension: Secondary | ICD-10-CM | POA: Insufficient documentation

## 2018-02-12 DIAGNOSIS — Z79899 Other long term (current) drug therapy: Secondary | ICD-10-CM | POA: Insufficient documentation

## 2018-02-12 DIAGNOSIS — K640 First degree hemorrhoids: Secondary | ICD-10-CM | POA: Diagnosis not present

## 2018-02-12 DIAGNOSIS — Z1211 Encounter for screening for malignant neoplasm of colon: Secondary | ICD-10-CM | POA: Diagnosis present

## 2018-02-12 DIAGNOSIS — R7303 Prediabetes: Secondary | ICD-10-CM | POA: Diagnosis not present

## 2018-02-12 HISTORY — PX: COLONOSCOPY WITH PROPOFOL: SHX5780

## 2018-02-12 HISTORY — DX: Prediabetes: R73.03

## 2018-02-12 SURGERY — COLONOSCOPY WITH PROPOFOL
Anesthesia: General

## 2018-02-12 MED ORDER — PHENYLEPHRINE HCL 10 MG/ML IJ SOLN
INTRAMUSCULAR | Status: DC | PRN
  Administered 2018-02-12 (×2): 100 ug via INTRAVENOUS

## 2018-02-12 MED ORDER — LIDOCAINE HCL (CARDIAC) PF 100 MG/5ML IV SOSY
PREFILLED_SYRINGE | INTRAVENOUS | Status: DC | PRN
  Administered 2018-02-12: 2.5 mL via INTRAVENOUS

## 2018-02-12 MED ORDER — SODIUM CHLORIDE 0.9 % IV SOLN
INTRAVENOUS | Status: DC
  Administered 2018-02-12: 1000 mL via INTRAVENOUS

## 2018-02-12 MED ORDER — PROPOFOL 500 MG/50ML IV EMUL
INTRAVENOUS | Status: AC
  Filled 2018-02-12: qty 50

## 2018-02-12 MED ORDER — PROPOFOL 500 MG/50ML IV EMUL
INTRAVENOUS | Status: DC | PRN
  Administered 2018-02-12: 130 ug/kg/min via INTRAVENOUS

## 2018-02-12 MED ORDER — PROPOFOL 10 MG/ML IV BOLUS
INTRAVENOUS | Status: DC | PRN
  Administered 2018-02-12: 80 mg via INTRAVENOUS

## 2018-02-12 NOTE — Anesthesia Post-op Follow-up Note (Signed)
Anesthesia QCDR form completed.        

## 2018-02-12 NOTE — Anesthesia Preprocedure Evaluation (Addendum)
Anesthesia Evaluation  Patient identified by MRN, date of birth, ID band Patient awake    Reviewed: Allergy & Precautions, H&P , NPO status , Patient's Chart, lab work & pertinent test results  Airway Mallampati: II  TM Distance: >3 FB Neck ROM: full    Dental  (+) Teeth Intact Dental hardware:   Pulmonary neg pulmonary ROS, neg COPD,           Cardiovascular hypertension, On Medications (-) angina(-) Past MI negative cardio ROS       Neuro/Psych negative neurological ROS  negative psych ROS   GI/Hepatic negative GI ROS, Neg liver ROS,   Endo/Other  negative endocrine ROS  Renal/GU negative Renal ROS  negative genitourinary   Musculoskeletal   Abdominal   Peds  Hematology negative hematology ROS (+)   Anesthesia Other Findings Past Medical History: No date: Family history of adverse reaction to anesthesia     Comment:  Father No date: History of kidney stones No date: Hypertension No date: Pre-diabetes     Comment:  diet controlled  Past Surgical History: 04/29/2017: HERNIA REPAIR 04/29/2017: INGUINAL HERNIA REPAIR; N/A     Comment:  Procedure: HERNIA REPAIR INGUINAL ADULT;  Surgeon:               Herbert Pun, MD;  Location: [facility] ORS;  Service:              General;  Laterality: N/A;  BMI    Body Mass Index:  27.96 kg/m      Reproductive/Obstetrics negative OB ROS                            Anesthesia Physical Anesthesia Plan  ASA: II  Anesthesia Plan: General   Post-op Pain Management:    Induction:   PONV Risk Score and Plan: Propofol infusion  Airway Management Planned:   Additional Equipment:   Intra-op Plan:   Post-operative Plan:   Informed Consent: I have reviewed the patients History and Physical, chart, labs and discussed the procedure including the risks, benefits and alternatives for the proposed anesthesia with the patient or authorized  representative who has indicated his/her understanding and acceptance.   Dental Advisory Given  Plan Discussed with: Anesthesiologist, CRNA and Surgeon  Anesthesia Plan Comments:         Anesthesia Quick Evaluation

## 2018-02-12 NOTE — Transfer of Care (Signed)
Immediate Anesthesia Transfer of Care Note  Patient: Louis Reyes  Procedure(s) Performed: COLONOSCOPY WITH PROPOFOL (N/A )  Patient Location: PACU  Anesthesia Type:General  Level of Consciousness: sedated  Airway & Oxygen Therapy: Patient Spontanous Breathing and Patient connected to nasal cannula oxygen  Post-op Assessment: Report given to RN and Post -op Vital signs reviewed and stable  Post vital signs: Reviewed and stable  Last Vitals:  Vitals Value Taken Time  BP 93/61 02/12/2018 10:03 AM  Temp 36.3 C 02/12/2018 10:01 AM  Pulse 74 02/12/2018 10:03 AM  Resp 23 02/12/2018 10:03 AM  SpO2 100 % 02/12/2018 10:03 AM    Last Pain:  Vitals:   02/12/18 1001  TempSrc: Tympanic  PainSc:          Complications: No apparent anesthesia complications

## 2018-02-12 NOTE — Anesthesia Postprocedure Evaluation (Signed)
Anesthesia Post Note  Patient: Louis Reyes  Procedure(s) Performed: COLONOSCOPY WITH PROPOFOL (N/A )  Patient location during evaluation: PACU Anesthesia Type: General Level of consciousness: awake and alert Pain management: pain level controlled Vital Signs Assessment: post-procedure vital signs reviewed and stable Respiratory status: spontaneous breathing, nonlabored ventilation and respiratory function stable Cardiovascular status: blood pressure returned to baseline and stable Postop Assessment: no apparent nausea or vomiting Anesthetic complications: no     Last Vitals:  Vitals:   02/12/18 1020 02/12/18 1030  BP: 114/79 127/75  Pulse: 74 75  Resp: 13 12  Temp:    SpO2: 98% 99%    Last Pain:  Vitals:   02/12/18 1001  TempSrc: Tympanic  PainSc:                  Durenda Hurt

## 2018-02-12 NOTE — H&P (Signed)
Outpatient short stay form Pre-procedure 02/12/2018 9:41 AM Teodoro K. Alice Reichert, M.D.  Primary Physician: Meriel Flavors, M.D.  Reason for visit:  Colon cancer screening.  History of present illness:  Patient presents for colonoscopy for colon cancer screening. The patient denies complaints of abdominal pain, significant change in bowel habits, or rectal bleeding.     Current Facility-Administered Medications:  .  0.9 %  sodium chloride infusion, , Intravenous, Continuous, [place], Benay Pike, MD, Last Rate: 20 mL/hr at 02/12/18 0910, 1,000 mL at 02/12/18 0910  Medications Prior to Admission  Medication Sig Dispense Refill Last Dose  . amLODipine (NORVASC) 5 MG tablet Take 5 mg by mouth daily.     . Multiple Vitamin (MULTIVITAMIN) tablet Take 1 tablet by mouth daily.     Marland Kitchen losartan (COZAAR) 50 MG tablet Take 50 mg by mouth daily.  0 04/28/2017 at Unknown time     No Known Allergies   Past Medical History:  Diagnosis Date  . Family history of adverse reaction to anesthesia    Father  . History of kidney stones   . Hypertension   . Pre-diabetes    diet controlled    Review of systems:  Otherwise negative.    Physical Exam  Gen: Alert, oriented. Appears stated age.  HEENT: [place]/AT. PERRLA. Lungs: CTA, no wheezes. CV: RR nl S1, S2. Abd: soft, benign, no masses. BS+ Ext: No edema. Pulses 2+    Planned procedures: Proceed with colonoscopy. The patient understands the nature of the planned procedure, indications, risks, alternatives and potential complications including but not limited to bleeding, infection, perforation, damage to internal organs and possible oversedation/side effects from anesthesia. The patient agrees and gives consent to proceed.  Please refer to procedure notes for findings, recommendations and patient disposition/instructions.     Teodoro K. Alice Reichert, M.D. Gastroenterology 02/12/2018  9:41 AM

## 2018-02-12 NOTE — Interval H&P Note (Signed)
History and Physical Interval Note:  02/12/2018 9:42 AM  Louis Reyes  has presented today for surgery, with the diagnosis of COLON CANCER SCREENING  The various methods of treatment have been discussed with the patient and family. After consideration of risks, benefits and other options for treatment, the patient has consented to  Procedure(s): COLONOSCOPY WITH PROPOFOL (N/A) as a surgical intervention .  The patient's history has been reviewed, patient examined, no change in status, stable for surgery.  I have reviewed the patient's chart and labs.  Questions were answered to the patient's satisfaction.     [place], [place]

## 2018-02-12 NOTE — Op Note (Signed)
[facility] Gastroenterology Patient Name: Louis Reyes Procedure Date: 02/12/2018 9:31 AM MRN: [id number] Account #: [id number] Date of Birth: 1956/04/09 Admit Type: Outpatient Age: 61 Room: [facility] ENDO ROOM 4 Gender: Male Note Status: Finalized Procedure:            Colonoscopy Indications:          Screening for colorectal malignant neoplasm Providers:            Benay Pike. Aramis Zobel MD, MD Medicines:            Propofol per Anesthesia Complications:        No immediate complications. Procedure:            Pre-Anesthesia Assessment:                       - The risks and benefits of the procedure and the                        sedation options and risks were discussed with the                        patient. All questions were answered and informed                        consent was obtained.                       - Patient identification and proposed procedure were                        verified prior to the procedure by the nurse. The                        procedure was verified in the procedure room.                       - ASA Grade Assessment: II - A patient with mild                        systemic disease.                       - After reviewing the risks and benefits, the patient                        was deemed in satisfactory condition to undergo the                        procedure.                       After obtaining informed consent, the colonoscope was                        passed under direct vision. Throughout the procedure,                        the patient's blood pressure, pulse, and oxygen                        saturations were monitored continuously. The  Colonoscope was introduced through the anus and                        advanced to the the cecum, identified by appendiceal                        orifice and ileocecal valve. The colonoscopy was                        performed without difficulty. The  patient tolerated the                        procedure well. The quality of the bowel preparation                        was good. The ileocecal valve, appendiceal orifice, and                        rectum were photographed. Findings:      The perianal and digital rectal examinations were normal. Pertinent       negatives include normal sphincter tone and no palpable rectal lesions.      The colon (entire examined portion) appeared normal.      Non-bleeding internal hemorrhoids were found during retroflexion. The       hemorrhoids were Grade I (internal hemorrhoids that do not prolapse).      The exam was otherwise without abnormality. Impression:           - The entire examined colon is normal.                       - Non-bleeding internal hemorrhoids.                       - The examination was otherwise normal.                       - No specimens collected. Recommendation:       - Patient has a contact number available for                        emergencies. The signs and symptoms of potential                        delayed complications were discussed with the patient.                        Return to normal activities tomorrow. Written discharge                        instructions were provided to the patient.                       - Resume previous diet.                       - Continue present medications.                       - Repeat colonoscopy in 10 years for screening purposes.                       -  The findings and recommendations were discussed with                        the patient and their family. Procedure Code(s):    --- Professional ---                       W0981, Colorectal cancer screening; colonoscopy on                        individual not meeting criteria for high risk Diagnosis Code(s):    --- Professional ---                       K64.0, First degree hemorrhoids                       Z12.11, Encounter for screening for malignant neoplasm                         of colon CPT copyright 2017 American Medical Association. All rights reserved. The codes documented in this report are preliminary and upon coder review may  be revised to meet current compliance requirements. Efrain Sella MD, MD 02/12/2018 10:00:50 AM This report has been signed electronically. Number of Addenda: 0 Note Initiated On: 02/12/2018 9:31 AM Scope Withdrawal Time: 0 hours 6 minutes 18 seconds  Total Procedure Duration: 0 hours 11 minutes 49 seconds       [facility]

## 2018-02-13 ENCOUNTER — Encounter: Payer: Self-pay | Admitting: Internal Medicine

## 2019-04-30 ENCOUNTER — Emergency Department: Payer: Worker's Compensation

## 2019-04-30 ENCOUNTER — Encounter: Payer: Self-pay | Admitting: Intensive Care

## 2019-04-30 ENCOUNTER — Other Ambulatory Visit: Payer: Self-pay

## 2019-04-30 ENCOUNTER — Emergency Department
Admission: EM | Admit: 2019-04-30 | Discharge: 2019-04-30 | Disposition: A | Discharge status: Home / Self Care | Payer: Worker's Compensation | Attending: Emergency Medicine | Admitting: Emergency Medicine

## 2019-04-30 DIAGNOSIS — M79621 Pain in right upper arm: Secondary | ICD-10-CM | POA: Insufficient documentation

## 2019-04-30 DIAGNOSIS — M79631 Pain in right forearm: Secondary | ICD-10-CM | POA: Insufficient documentation

## 2019-04-30 DIAGNOSIS — Y9389 Activity, other specified: Secondary | ICD-10-CM | POA: Insufficient documentation

## 2019-04-30 DIAGNOSIS — W230XXA Caught, crushed, jammed, or pinched between moving objects, initial encounter: Secondary | ICD-10-CM | POA: Diagnosis not present

## 2019-04-30 DIAGNOSIS — Z79899 Other long term (current) drug therapy: Secondary | ICD-10-CM | POA: Diagnosis not present

## 2019-04-30 DIAGNOSIS — Y9289 Other specified places as the place of occurrence of the external cause: Secondary | ICD-10-CM | POA: Insufficient documentation

## 2019-04-30 DIAGNOSIS — Y990 Civilian activity done for income or pay: Secondary | ICD-10-CM | POA: Insufficient documentation

## 2019-04-30 DIAGNOSIS — S6991XA Unspecified injury of right wrist, hand and finger(s), initial encounter: Secondary | ICD-10-CM | POA: Diagnosis present

## 2019-04-30 DIAGNOSIS — I10 Essential (primary) hypertension: Secondary | ICD-10-CM | POA: Diagnosis not present

## 2019-04-30 DIAGNOSIS — S6741XA Crushing injury of right wrist and hand, initial encounter: Secondary | ICD-10-CM | POA: Diagnosis not present

## 2019-04-30 MED ORDER — MELOXICAM 15 MG PO TABS: 15.0000 mg | ORAL_TABLET | Freq: Every day | ORAL | 2 refills | Status: AC

## 2019-04-30 NOTE — ED Notes (Signed)
Pt with deformity on right forearm. Waiting on interpreter.

## 2019-04-30 NOTE — Discharge Instructions (Signed)
Follow-up with orthopedics.  Take medication as prescribed.  Keep the hand elevated and iced.

## 2019-04-30 NOTE — ED Notes (Signed)
Interpreter on ipad not working,hospital  interpreter requested

## 2019-04-30 NOTE — ED Provider Notes (Signed)
[facility] Emergency Department Provider Note  ____________________________________________   First MD Initiated Contact with Patient 04/30/19 1853     (approximate)  I have reviewed the triage vital signs and the nursing notes.   HISTORY  Chief Complaint Arm Pain (right)    HPI Louis Reyes is a 63 y.o. male presents emergency department complaining of right arm pain.  He states he was at work and a machine that compresses things pulled his arm into the machine.  He denies that it was a twisting motion.  He states that compressed down on the arm.  He is complaining of upper arm and lower arm pain along with hand pain.  No other injuries reported.  No broken skin.  Patient does state that he has some numbness and tingling in the second third and fourth fingers, patient is right-handed   Past Medical History:  Diagnosis Date   Family history of adverse reaction to anesthesia    Father   History of kidney stones    Hypertension    Pre-diabetes    diet controlled    There are no active problems to display for this patient.   Past Surgical History:  Procedure Laterality Date   COLONOSCOPY WITH PROPOFOL N/A 02/12/2018   Procedure: COLONOSCOPY WITH PROPOFOL;  Surgeon: Toledo, Benay Pike, MD;  Location: [facility] ENDOSCOPY;  Service: Gastroenterology;  Laterality: N/A;   HERNIA REPAIR  04/29/2017   INGUINAL HERNIA REPAIR N/A 04/29/2017   Procedure: HERNIA REPAIR INGUINAL ADULT;  Surgeon: Herbert Pun, MD;  Location: [facility] ORS;  Service: General;  Laterality: N/A;    Prior to Admission medications   Medication Sig Start Date End Date Taking? Authorizing Provider  amLODipine (NORVASC) 5 MG tablet Take 5 mg by mouth daily.    [provider]  losartan (COZAAR) 50 MG tablet Take 50 mg by mouth daily. 04/01/17   [provider]  meloxicam (MOBIC) 15 MG tablet Take 1 tablet (15 mg total) by mouth daily. 04/30/19 04/29/20   Versie Starks, PA-C  Multiple Vitamin (MULTIVITAMIN) tablet Take 1 tablet by mouth daily.    [provider]    Allergies Patient has no known allergies.  Family History  Problem Relation Age of Onset   Other Mother    Other Father     Social History Social History   Tobacco Use   Smoking status: Never Smoker   Smokeless tobacco: Never Used  Substance Use Topics   Alcohol use: Yes    Alcohol/week: 1.0 - 2.0 standard drinks    Types: 1 - 2 Cans of beer per week    Comment: very seldom   Drug use: Never    Review of Systems  Constitutional: No fever/chills Eyes: No visual changes. ENT: No sore throat. Respiratory: Denies cough Genitourinary: Negative for dysuria. Musculoskeletal: Negative for back pain.  Positive for right arm and hand pain Skin: Negative for rash.    ____________________________________________   PHYSICAL EXAM:  VITAL SIGNS: ED Triage Vitals  Enc Vitals Group     BP 04/30/19 1838 (!) 160/74     Pulse Rate 04/30/19 1838 (!) 103     Resp 04/30/19 1838 14     Temp 04/30/19 1838 98.3 F (36.8 C)     Temp Source 04/30/19 1838 Oral     SpO2 04/30/19 1838 98 %     Weight 04/30/19 1839 163 lb (73.9 kg)     Height 04/30/19 1839 5\' 5"  (1.651 m)  Head Circumference --      Peak Flow --      Pain Score 04/30/19 1839 7     Pain Loc --      Pain Edu? --      Excl. in [place]? --     Constitutional: Alert and oriented. Well appearing and in no acute distress. Eyes: Conjunctivae are normal.  Head: Atraumatic. Nose: No congestion/rhinnorhea. Mouth/Throat: Mucous membranes are moist.   Cardiovascular: Normal rate, regular rhythm.  Respiratory: Normal respiratory effort.  No retractions,  GU: deferred Musculoskeletal: FROM all extremities, warm and well perfused, swelling noted at the right wrist and hand, right humerus is tender, right forearm is tender.  Neurovascular appears to be intact. Neurologic:  Normal speech and language.    Skin:  Skin is warm, dry and intact. No rash noted. Psychiatric: Mood and affect are normal. Speech and behavior are normal.  ____________________________________________   LABS (all labs ordered are listed, but only abnormal results are displayed)  Labs Reviewed - No data to display ____________________________________________   ____________________________________________  RADIOLOGY  X-ray of the right humerus is negative, x-ray of the right forearm is negative, x-ray of the right hand is negative  ____________________________________________   PROCEDURES  Procedure(s) performed: Velcro cock up splint, sling   Procedures    ____________________________________________   INITIAL IMPRESSION / ASSESSMENT AND PLAN / ED COURSE  Pertinent labs & imaging results that were available during my care of the patient were reviewed by me and considered in my medical decision making (see chart for details).   Patient is a 63 year old male presents emergency department complaint of a Worker's Comp. injury of the right arm.  See HPI  Physical exam shows the right arm to be tender.  X-ray of the right humerus, right forearm, and right hand are negative for fracture  Explained the results to the patient.  Explained to him that this is a crush injury we worry about soft tissue damage.  He was placed in a Velcro cock up splint to help decrease the swelling in the right wrist.  He was given a sling to keep the hand and arm elevated.  Work work note was sent stated that the patient should not use the right arm until evaluated by orthopedics.  Compartment syndrome discussed with the patient and his daughter.  He was given a prescription for meloxicam.  He was given a referral to orthopedics.  I did explain to him that if [employer] does not approve of the physician that he should see an orthopedic of Worker's Comp. choice.  He states he understands.  All of this was conveyed by the [facility]  interpreter and [employer] interpreter.    [name] was evaluated in Emergency Department on 04/30/2019 for the symptoms described in the history of present illness. He was evaluated in the context of the global COVID-19 pandemic, which necessitated consideration that the patient might be at risk for infection with the SARS-CoV-2 virus that causes COVID-19. Institutional protocols and algorithms that pertain to the evaluation of patients at risk for COVID-19 are in a state of rapid change based on information released by regulatory bodies including the CDC and federal and state organizations. These policies and algorithms were followed during the patient's care in the ED.   As part of my medical decision making, I reviewed the following data within the [place] History obtained from family, Nursing notes reviewed and incorporated, Interpreter needed, Old chart reviewed, Radiograph reviewed  x-rays are negative for fracture, Notes from prior ED visits and [place] Controlled Substance Database  ____________________________________________   FINAL CLINICAL IMPRESSION(S) / ED DIAGNOSES  Final diagnoses:  Crushing injury of right wrist and hand, initial encounter      NEW MEDICATIONS STARTED DURING THIS VISIT:  New Prescriptions   MELOXICAM (MOBIC) 15 MG TABLET    Take 1 tablet (15 mg total) by mouth daily.     Note:  This document was prepared using Dragon voice recognition software and may include unintentional dictation errors.    Versie Starks, PA-C 04/30/19 2039    Nena Polio, MD 04/30/19 2250

## 2019-04-30 NOTE — ED Notes (Signed)
EDP and medical interpreter in room

## 2019-04-30 NOTE — ED Triage Notes (Addendum)
Patient reports he was at work and hurt his right arm on a machine. No open lacerations. C/o pain from right shoulder down to right wrist. Patient works at [employer] and would like [employer]

## 2019-04-30 NOTE — ED Notes (Signed)
Interpreter in room for discharge instructions.

## 2019-04-30 NOTE — ED Notes (Signed)
EDP in room  

## 2019-06-01 ENCOUNTER — Ambulatory Visit: Payer: Worker's Compensation | Attending: Orthopaedic Surgery | Admitting: Occupational Therapy

## 2019-06-01 ENCOUNTER — Other Ambulatory Visit: Payer: Self-pay

## 2019-06-01 ENCOUNTER — Encounter: Payer: Self-pay | Admitting: Occupational Therapy

## 2019-06-01 DIAGNOSIS — M79601 Pain in right arm: Secondary | ICD-10-CM

## 2019-06-01 DIAGNOSIS — M542 Cervicalgia: Secondary | ICD-10-CM | POA: Diagnosis present

## 2019-06-01 DIAGNOSIS — M6281 Muscle weakness (generalized): Secondary | ICD-10-CM | POA: Diagnosis present

## 2019-06-01 DIAGNOSIS — R202 Paresthesia of skin: Secondary | ICD-10-CM | POA: Diagnosis present

## 2019-06-01 DIAGNOSIS — M25641 Stiffness of right hand, not elsewhere classified: Secondary | ICD-10-CM | POA: Diagnosis present

## 2019-06-01 DIAGNOSIS — R200 Anesthesia of skin: Secondary | ICD-10-CM | POA: Insufficient documentation

## 2019-06-01 DIAGNOSIS — M25631 Stiffness of right wrist, not elsewhere classified: Secondary | ICD-10-CM | POA: Insufficient documentation

## 2019-06-01 NOTE — Patient Instructions (Signed)
Contrast 3 x day isotoner glove during day  Cont with wrist splint at night time   Tendon glides - AROM pain free and do not force - 10 reps Same for Opposition to all digits 5 reps  AROM for RD, UD ,wrist flexion , ext 10 reps pain free   2 x day  After heat or warm shower on R upper traps  Lateral flexion L and R Scalenes stretches  Cervical rotation L and R 10reps  Keep pain under 2/10  No increase numbness with HEP  No pressure thru palm or gripping objects tight

## 2019-06-01 NOTE — Therapy (Signed)
[place] [facility] [address] [place], [address] Phone: [phone]   Fax:  [phone]  Occupational Therapy Evaluation  Patient Details  Name: Louis Reyes MRN: [id number] Date of Birth: [birth year] Referring Provider (OT): Dr Harlin Heys   Encounter Date: 06/01/2019  OT End of Session - 06/01/19 1627    Visit Number  1    Number of Visits  12    Date for OT Re-Evaluation  07/13/19    Authorization Type  workers comp    Authorization - Visit Number  1    Authorization - Number of Visits  8    OT Start Time  1040    OT Stop Time  1135    OT Time Calculation (min)  55 min    Activity Tolerance  Patient tolerated treatment well    Behavior During Therapy  [facility] for tasks assessed/performed       Past Medical History:  Diagnosis Date  . Family history of adverse reaction to anesthesia    Father  . History of kidney stones   . Hypertension   . Pre-diabetes    diet controlled    Past Surgical History:  Procedure Laterality Date  . COLONOSCOPY WITH PROPOFOL N/A 02/12/2018   Procedure: COLONOSCOPY WITH PROPOFOL;  Surgeon: Toledo, Benay Pike, MD;  Location: [facility] ENDOSCOPY;  Service: Gastroenterology;  Laterality: N/A;  . HERNIA REPAIR  04/29/2017  . INGUINAL HERNIA REPAIR N/A 04/29/2017   Procedure: HERNIA REPAIR INGUINAL ADULT;  Surgeon: Herbert Pun, MD;  Location: [facility] ORS;  Service: General;  Laterality: N/A;    There were no vitals filed for this visit.  Subjective Assessment - 06/01/19 1612    Subjective   I injured my arm at work - the machine pulled it in and squeeze it - my fingers and part of hand goes numb and pain at my wrist and forearm - as well as my L side of neck    Patient is accompanied by:  Interpreter    Pertinent History  Pt injury occur on 04/30/2019 - arm was pulled into machine and sqeeze - resulting in pain in R hand to shoulder/upper traps , numbness thumb thru  4th digit- swelling - xrays was negative at ER on 11/5 - seen ortho on 11/17 - refer to therapy    Patient Stated Goals  Want the pain, numbness in my R arm better so I can use my hand in my every day activiities and do my job    Currently in Pain?  Yes    Pain Score  6     Pain Location  Arm    Pain Orientation  Right    Pain Descriptors / Indicators  Aching;Numbness;Tender;Tingling    Pain Type  Acute pain    Pain Onset  More than a month ago        [facility] OT Assessment - 06/01/19 0001      Assessment   Medical Diagnosis  R arm crushing injury - pain , numbness     Referring Provider (OT)  Dr Harlin Heys    Onset Date/Surgical Date  04/30/19    Hand Dominance  Right    Next MD Visit  --   per pt MD said 3-4 wks - but do not have appt     Home  Environment   Lives With  Family      Prior Function   Vocation  Full time employment  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 , yard work , some house work       Nurse, mental health (lbs)  30    Right Hand Lateral Pinch  8 lbs    Right Hand 3 Point Pinch  5 lbs    Left Hand Grip (lbs)  80    Left Hand Lateral Pinch  19 lbs    Left Hand 3 Point Pinch  16 lbs      Right Hand AROM   R Thumb Opposition to Index  --   2nd fold of 5th        Contrast done with pt and ed on doing at home -prior to HEP    Contrast 3 x day isotoner glove during day  Cont with wrist splint at night time   Tendon glides - AROM pain free and do not force - 10 reps Same for Opposition to all digits 5 reps  AROM for RD, UD ,wrist flexion , ext 10 reps pain free   2 x day  After heat or warm shower on R upper traps  Lateral flexion L and R Scalenes stretches  Cervical rotation L and R 10reps  Keep pain under 2/10  No increase numbness with HEP  No pressure thru palm or gripping objects tight            OT Education - 06/01/19 1626    Education Details  findings of eval and HEP    Person(s) Educated  Patient     Methods  Explanation;Demonstration;Tactile cues;Verbal cues;Handout    Comprehension  Verbalized understanding;Returned demonstration       OT Short Term Goals - 06/01/19 1632      OT SHORT TERM GOAL #1   Title  Pain on PRWHE improve with more than 20 points    Baseline  pain on PRWHE with eval 31/50    Time  3    Period  Weeks    Status  New    Target Date  06/22/19      OT SHORT TERM GOAL #2   Title  Pt to be independent in HEP to decrease pain , numbness and increase ROM /strength    Baseline  not knowledge on HEP    Time  3    Period  Weeks    Status  New    Target Date  06/22/19        OT Long Term Goals - 06/01/19 1635      OT LONG TERM GOAL #1   Title  R wrist and digits AROM in functional tasks increase of symptoms less than 2/10    Baseline  pain at rest volar wrist and forearm 6/10 , increase to 9/10 ; numbness increase during day    Time  4    Period  Weeks    Status  New    Target Date  06/29/19      OT LONG TERM GOAL #2   Title  R upper trap tightness and pain improve for pt to show cervical AROM WNL with pain less than 2/10    Baseline  tenderness , pain 4/10 R upper trap - decrease cervical  rotation , lat flexion both  ways    Time  6    Period  Weeks    Status  New    Target Date  07/13/19      OT LONG TERM GOAL #3   Title  R  grip and prehension strength improve to more than 50% without increase symptoms to use hand with more ease in pushing up , carry more than 5 lbs , cut food    Baseline  Grip R 30 ,L 80 ; lat grip R 8; L 19 lbs , 3 point R 5, L 16 lbs - and functional use 28%    Time  6    Period  Weeks    Status  New    Target Date  07/13/19      OT LONG TERM GOAL #4   Title  Function score on PRWHE improve with more than 20 points    Baseline  Function score on PRWHE 37/50 at eval    Time  6    Period  Weeks    Status  New    Target Date  07/13/19            Plan - 06/01/19 1628    Clinical Impression Statement  Pt is 4 1/2  wks out from crushing injury of arm pulled in machine and sqeezed - xrays was negatvie but appear pt had some soft tissue injury to volar wrist and forearm - with Med N involvement with sensory changes - decease AROM and grip /prehension strength , increase pain and numbness - also with traction pt having some R upper trap tighness , pain - with decrase cervical ROM - all limting his functionals of R domimant arm in ADL's and IADL's    OT Occupational Profile and History  Problem Focused Assessment - Including review of records relating to presenting problem    Occupational performance deficits (Please refer to evaluation for details):  ADL's;IADL's;Work;Play;Leisure;Social Participation    Body Structure / Function / Physical Skills  ADL;Decreased knowledge of precautions;Flexibility;ROM;UE functional use;FMC;Muscle spasms;Sensation;Edema;Pain;Strength;IADL    Rehab Potential  Good    Clinical Decision Making  Several treatment options, min-mod task modification necessary    Comorbidities Affecting Occupational Performance:  None    Modification or Assistance to Complete Evaluation   No modification of tasks or assist necessary to complete eval    OT Frequency  2x / week    OT Duration  6 weeks    OT Treatment/Interventions  Self-care/ADL training;Therapeutic exercise;Patient/family education;Splinting;Paraffin;Fluidtherapy;Contrast Bath;Ultrasound;Manual Therapy;Passive range of motion    Plan  assess progress with HEP and modify as needed    OT Home Exercise Plan  see pt instruction    Consulted and Agree with Plan of Care  Patient       Patient will benefit from skilled therapeutic intervention in order to improve the following deficits and impairments:   Body Structure / Function / Physical Skills: ADL, Decreased knowledge of precautions, Flexibility, ROM, UE functional use, FMC, Muscle spasms, Sensation, Edema, Pain, Strength, IADL       Visit Diagnosis: Pain in right arm - Plan: Ot  plan of care cert/re-cert  Numbness and tingling in right hand - Plan: Ot plan of care cert/re-cert  Stiffness of right hand, not elsewhere classified - Plan: Ot plan of care cert/re-cert  Stiffness of right wrist, not elsewhere classified - Plan: Ot plan of care cert/re-cert  Cervicalgia - Plan: Ot plan of care cert/re-cert  Muscle weakness (generalized) - Plan: Ot plan of care cert/re-cert    Problem List There are no active problems to display for this patient.   Rosalyn Gess OTR/L,CLT 06/01/2019, 5:02 PM  [place] [facility] [address], [place],  [place] Phone: [phone]   Fax:  [phone]  Name: Louis Reyes MRN: [id number] Date of Birth: [birth year]

## 2019-06-04 ENCOUNTER — Other Ambulatory Visit: Payer: Self-pay

## 2019-06-04 ENCOUNTER — Ambulatory Visit: Payer: Worker's Compensation | Attending: Orthopaedic Surgery | Admitting: Occupational Therapy

## 2019-06-04 DIAGNOSIS — M542 Cervicalgia: Secondary | ICD-10-CM | POA: Diagnosis present

## 2019-06-04 DIAGNOSIS — M79601 Pain in right arm: Secondary | ICD-10-CM | POA: Diagnosis present

## 2019-06-04 DIAGNOSIS — R200 Anesthesia of skin: Secondary | ICD-10-CM | POA: Insufficient documentation

## 2019-06-04 DIAGNOSIS — M25631 Stiffness of right wrist, not elsewhere classified: Secondary | ICD-10-CM | POA: Insufficient documentation

## 2019-06-04 DIAGNOSIS — R202 Paresthesia of skin: Secondary | ICD-10-CM | POA: Diagnosis present

## 2019-06-04 DIAGNOSIS — M25641 Stiffness of right hand, not elsewhere classified: Secondary | ICD-10-CM | POA: Diagnosis present

## 2019-06-04 DIAGNOSIS — M6281 Muscle weakness (generalized): Secondary | ICD-10-CM | POA: Diagnosis present

## 2019-06-04 NOTE — Patient Instructions (Signed)
Same but add CT spreads after contrast  10 reps  Reinforce Med N glides 2 x day 5 reps

## 2019-06-04 NOTE — Therapy (Signed)
[facility] [address] [place], [address] Phone: [phone]   Fax:  [phone]  Occupational Therapy Treatment  Patient Details  Name: Louis Reyes MRN: [id number] Date of Birth: 1956-01-08 Referring Provider (OT): Dr Harlin Heys   Encounter Date: 06/04/2019  OT End of Session - 06/04/19 1328    Visit Number  2    Number of Visits  12    Date for OT Re-Evaluation  07/13/19    Authorization Type  workers comp    Authorization - Visit Number  2    Authorization - Number of Visits  8    OT Start Time  1117    OT Stop Time  1202    OT Time Calculation (min)  45 min    Activity Tolerance  Patient tolerated treatment well    Behavior During Therapy  [facility] for tasks assessed/performed       Past Medical History:  Diagnosis Date  . Family history of adverse reaction to anesthesia    Father  . History of kidney stones   . Hypertension   . Pre-diabetes    diet controlled    Past Surgical History:  Procedure Laterality Date  . COLONOSCOPY WITH PROPOFOL N/A 02/12/2018   Procedure: COLONOSCOPY WITH PROPOFOL;  Surgeon: Toledo, Benay Pike, MD;  Location: [facility];  Service: Gastroenterology;  Laterality: N/A;  . HERNIA REPAIR  04/29/2017  . INGUINAL HERNIA REPAIR N/A 04/29/2017   Procedure: HERNIA REPAIR INGUINAL ADULT;  Surgeon: Herbert Pun, MD;  Location: [facility]S;  Service: General;  Laterality: N/A;    There were no vitals filed for this visit.  Subjective Assessment - 06/04/19 1212    Subjective   My hand , arm and neck feels better- numbness is better and the pain is less    Patient is accompanied by:  Interpreter    Pertinent History  Pt injury occur on 04/30/2019 - arm was pulled into machine and sqeeze - resulting in pain in R hand to shoulder/upper traps , numbness thumb thru 4th digit- swelling - xrays was negative at ER on 11/5 - seen ortho on 11/17 - refer to  therapy    Patient Stated Goals  Want the pain, numbness in my R arm better so I can use my hand in my every day activiities and do my job    Currently in Pain?  Yes    Pain Score  3    wrist AROM , neck rotation to R and lat flexion L   Pain Location  Arm    Pain Orientation  Right    Pain Descriptors / Indicators  Aching;Numbness;Tingling    Pain Type  Acute pain    Pain Onset  More than a month ago      Pt report digits not as numb and pain is better- increase opposition to base of 5th and composite fist - AROM WNL   middle finger most numb  Less pain with tapping over CT  Assess cervical AROM - increase AROM with less pain              OT Treatments/Exercises (OP) - 06/04/19 0001      RUE Contrast Bath   Time  9 minutes    Comments  Contrast to R hand and wrist -prior to soft tissue  and AROM to decrease pain and stiffness       Graston tool nr 2 for brushing and sweeping  over volar wrist and forearm And MC  And Carpal spreads prior to AROM  Graston tool nr 4 over upper traps - sweeping on R  Prior to cervical AROM in all planes    Lateral flexion L and R Scalenes stretches  Cervical rotation L and R 10reps   Pt to cont with Contrast   Contrast 3 x day isotoner glove during day  Cont with wrist splint at night time   Tendon glides - AROM pain free and do not force - 10 reps Same for Opposition to all digits 5 reps  AROM for RD, UD ,wrist flexion , ext 10 reps pain free Med N glide review - but only 5 reps 2 x day And add carpal spreads at home   Reinforce again   Keep pain under 2/10  No increase numbness with HEP  No pressure thru palm or gripping objects tight      OT Education - 06/04/19 1217    Education Details  progress and changes to HEP    Person(s) Educated  Patient    Methods  Explanation;Demonstration;Tactile cues;Verbal cues;Handout    Comprehension  Verbalized understanding;Returned demonstration       OT Short Term Goals -  06/01/19 1632      OT SHORT TERM GOAL #1   Title  Pain on PRWHE improve with more than 20 points    Baseline  pain on PRWHE with eval 31/50    Time  3    Period  Weeks    Status  New    Target Date  06/22/19      OT SHORT TERM GOAL #2   Title  Pt to be independent in HEP to decrease pain , numbness and increase ROM /strength    Baseline  not knowledge on HEP    Time  3    Period  Weeks    Status  New    Target Date  06/22/19        OT Long Term Goals - 06/01/19 1635      OT LONG TERM GOAL #1   Title  R wrist and digits AROM in functional tasks increase of symptoms less than 2/10    Baseline  pain at rest volar wrist and forearm 6/10 , increase to 9/10 ; numbness increase during day    Time  4    Period  Weeks    Status  New    Target Date  06/29/19      OT LONG TERM GOAL #2   Title  R upper trap tightness and pain improve for pt to show cervical AROM WNL with pain less than 2/10    Baseline  tenderness , pain 4/10 R upper trap - decrease cervical  rotation , lat flexion both  ways    Time  6    Period  Weeks    Status  New    Target Date  07/13/19      OT LONG TERM GOAL #3   Title  R grip and prehension strength improve to more than 50% without increase symptoms to use hand with more ease in pushing up , carry more than 5 lbs , cut food    Baseline  Grip R 30 ,L 80 ; lat grip R 8; L 19 lbs , 3 point R 5, L 16 lbs - and functional use 28%    Time  6    Period  Weeks    Status  New  Target Date  07/13/19      OT LONG TERM GOAL #4   Title  Function score on PRWHE improve with more than 20 points    Baseline  Function score on PRWHE 37/50 at eval    Time  6    Period  Weeks    Status  New    Target Date  07/13/19            Plan - 06/04/19 1329    Clinical Impression Statement  Pt is 5 wks out from crushing injury of arm pulled in machine and squeezed - xrays negative for fx - pt report decrease pain and numbness in R hand , wrist and upper traps - showed  increase AROM with less pain    OT Occupational Profile and History  Problem Focused Assessment - Including review of records relating to presenting problem    Occupational performance deficits (Please refer to evaluation for details):  ADL's;IADL's;Work;Play;Leisure;Social Participation    Body Structure / Function / Physical Skills  ADL;Decreased knowledge of precautions;Flexibility;ROM;UE functional use;FMC;Muscle spasms;Sensation;Edema;Pain;Strength;IADL    Rehab Potential  Good    Clinical Decision Making  Several treatment options, min-mod task modification necessary    Comorbidities Affecting Occupational Performance:  None    Modification or Assistance to Complete Evaluation   No modification of tasks or assist necessary to complete eval    OT Frequency  2x / week    OT Duration  6 weeks    OT Treatment/Interventions  Self-care/ADL training;Therapeutic exercise;Patient/family education;Splinting;Paraffin;Fluidtherapy;Contrast Bath;Ultrasound;Manual Therapy;Passive range of motion    Plan  assess progress with HEP and modify as needed    OT Home Exercise Plan  see pt instruction    Consulted and Agree with Plan of Care  Patient       Patient will benefit from skilled therapeutic intervention in order to improve the following deficits and impairments:   Body Structure / Function / Physical Skills: ADL, Decreased knowledge of precautions, Flexibility, ROM, UE functional use, FMC, Muscle spasms, Sensation, Edema, Pain, Strength, IADL       Visit Diagnosis: Pain in right arm  Numbness and tingling in right hand  Stiffness of right hand, not elsewhere classified  Stiffness of right wrist, not elsewhere classified  Cervicalgia  Muscle weakness (generalized)    Problem List There are no problems to display for this patient.   Rosalyn Gess OTR/L,CLT 06/04/2019, 1:31 PM  [place] [facility] [address], [place], [address] Phone: [phone]   Fax:  [phone]  Name: Louis Reyes MRN: [id number] Date of Birth: March 01, 1956

## 2019-06-08 ENCOUNTER — Other Ambulatory Visit: Payer: Self-pay

## 2019-06-08 ENCOUNTER — Ambulatory Visit: Payer: Worker's Compensation | Admitting: Occupational Therapy

## 2019-06-08 DIAGNOSIS — M25631 Stiffness of right wrist, not elsewhere classified: Secondary | ICD-10-CM

## 2019-06-08 DIAGNOSIS — M79601 Pain in right arm: Secondary | ICD-10-CM | POA: Diagnosis not present

## 2019-06-08 DIAGNOSIS — R200 Anesthesia of skin: Secondary | ICD-10-CM

## 2019-06-08 DIAGNOSIS — M25641 Stiffness of right hand, not elsewhere classified: Secondary | ICD-10-CM

## 2019-06-08 DIAGNOSIS — M542 Cervicalgia: Secondary | ICD-10-CM

## 2019-06-08 DIAGNOSIS — M6281 Muscle weakness (generalized): Secondary | ICD-10-CM

## 2019-06-08 NOTE — Patient Instructions (Signed)
Same HEP  

## 2019-06-08 NOTE — Therapy (Signed)
[place] [facility] [address], [place], [address] Phone: [phone]   Fax:  [phone]  Occupational Therapy Treatment  Patient Details  Name: Louis Reyes MRN: [id number] Date of Birth: 1956/01/11 Referring Provider (OT): Dr Harlin Heys   Encounter Date: 06/08/2019  OT End of Session - 06/08/19 1139    Visit Number  3    Number of Visits  12    Date for OT Re-Evaluation  07/13/19    Authorization Type  workers comp    Authorization - Visit Number  3    Authorization - Number of Visits  8    OT Start Time  1121    OT Stop Time  1200    OT Time Calculation (min)  39 min    Activity Tolerance  Patient tolerated treatment well    Behavior During Therapy  [facility] for tasks assessed/performed       Past Medical History:  Diagnosis Date  . Family history of adverse reaction to anesthesia    Father  . History of kidney stones   . Hypertension   . Pre-diabetes    diet controlled    Past Surgical History:  Procedure Laterality Date  . COLONOSCOPY WITH PROPOFOL N/A 02/12/2018   Procedure: COLONOSCOPY WITH PROPOFOL;  Surgeon: Toledo, Benay Pike, MD;  Location: [facility];  Service: Gastroenterology;  Laterality: N/A;  . HERNIA REPAIR  04/29/2017  . INGUINAL HERNIA REPAIR N/A 04/29/2017   Procedure: HERNIA REPAIR INGUINAL ADULT;  Surgeon: Herbert Pun, MD;  Location: [facility];  Service: General;  Laterality: N/A;    There were no vitals filed for this visit.  Subjective Assessment - 06/08/19 1206    Subjective   Neck is better, numbness till the worse in middle finger and then also thumb - pain better - and numbness - using it in light activities    Patient is accompanied by:  Interpreter    Pertinent History  Pt injury occur on 04/30/2019 - arm was pulled into machine and sqeeze - resulting in pain in R hand to shoulder/upper traps , numbness thumb thru 4th digit- swelling - xrays was  negative at ER on 11/5 - seen ortho on 11/17 - refer to therapy    Patient Stated Goals  Want the pain, numbness in my R arm better so I can use my hand in my every day activiities and do my job    Currently in Pain?  Yes    Pain Score  4     Pain Location  --   L upper traps   Pain Orientation  Right    Pain Descriptors / Indicators  Aching    Pain Type  Acute pain    Pain Onset  More than a month ago         [facility] OT Assessment - 06/08/19 0001      Strength   Right Hand Grip (lbs)  55    Right Hand Lateral Pinch  17 lbs    Right Hand 3 Point Pinch  11 lbs      Great progress in grip strength and numbness improving- more distal on 2nd digit - at PIP and 3rd and thumb worse - and no numbness in 4th  But no in palm - anymore  Pain better in volar wrist and forearm - as well as upper traps  About 4/10 at the worse  Cervical AROM improve in all planes  OT Treatments/Exercises (OP) - 06/08/19 0001      Moist Heat Therapy   Number Minutes Moist Heat  5 Minutes    Moist Heat Location  --   R upper traps      RUE Contrast Bath   Time  9 minutes    Comments  contrast to R hand prior to soft tissue        Graston tool nr 2 for brushing and sweeping over volar wrist and forearm And MC  And Carpal spreads prior to AROM - numbness improve in middle digits after graston  Graston tool nr 4 over upper traps - sweeping on R  Prior to cervical AROM in all planes - R better now than L   Lateral flexion L and R Scalenes stretches  Cervical rotation L and R 10reps   Pt to cont with Contrast  Contrast 3 x day isotoner glove during day  Cont with wrist splint at night time   Tendon glides done - AROM pain free and do not force - 10 reps\  Opposition to all digits 5 reps - no pain  AROM for RD, UD ,wrist flexion , ext 10 reps pain free Med N glide review - only 5 reps 2 x day  carpal spreads wife or grand daughter are doing at home per pt   Reinforce again    Keep pain under 2/10  No increase numbness with HEP  No pressure thru palm or gripping objects tight        OT Education - 06/08/19 1139    Education Details  progress and nerve healingand same  HEP    Person(s) Educated  Patient    Methods  Explanation;Demonstration;Tactile cues;Verbal cues;Handout    Comprehension  Verbalized understanding;Returned demonstration       OT Short Term Goals - 06/01/19 1632      OT SHORT TERM GOAL #1   Title  Pain on PRWHE improve with more than 20 points    Baseline  pain on PRWHE with eval 31/50    Time  3    Period  Weeks    Status  New    Target Date  06/22/19      OT SHORT TERM GOAL #2   Title  Pt to be independent in HEP to decrease pain , numbness and increase ROM /strength    Baseline  not knowledge on HEP    Time  3    Period  Weeks    Status  New    Target Date  06/22/19        OT Long Term Goals - 06/01/19 1635      OT LONG TERM GOAL #1   Title  R wrist and digits AROM in functional tasks increase of symptoms less than 2/10    Baseline  pain at rest volar wrist and forearm 6/10 , increase to 9/10 ; numbness increase during day    Time  4    Period  Weeks    Status  New    Target Date  06/29/19      OT LONG TERM GOAL #2   Title  R upper trap tightness and pain improve for pt to show cervical AROM WNL with pain less than 2/10    Baseline  tenderness , pain 4/10 R upper trap - decrease cervical  rotation , lat flexion both  ways    Time  6    Period  Weeks    Status  New    Target Date  07/13/19      OT LONG TERM GOAL #3   Title  R grip and prehension strength improve to more than 50% without increase symptoms to use hand with more ease in pushing up , carry more than 5 lbs , cut food    Baseline  Grip R 30 ,L 80 ; lat grip R 8; L 19 lbs , 3 point R 5, L 16 lbs - and functional use 28%    Time  6    Period  Weeks    Status  New    Target Date  07/13/19      OT LONG TERM GOAL #4   Title  Function score on  PRWHE improve with more than 20 points    Baseline  Function score on PRWHE 37/50 at eval    Time  6    Period  Weeks    Status  New    Target Date  07/13/19            Plan - 06/08/19 1140    Clinical Impression Statement  Pt is 5 1/2 wks from crushing injury to R arm - being pulled in machine and squeezed - xrays for fx was negative - pt report again decrease pain and numbnes cont to be the worse in 3rd and thumb - but improving and moving distally - and in session got better- grip and prehension strength increase greatly - pt cont to benefit from OT services    OT Occupational Profile and History  Problem Focused Assessment - Including review of records relating to presenting problem    Occupational performance deficits (Please refer to evaluation for details):  ADL's;IADL's;Work;Play;Leisure;Social Participation    Body Structure / Function / Physical Skills  ADL;Decreased knowledge of precautions;Flexibility;ROM;UE functional use;FMC;Muscle spasms;Sensation;Edema;Pain;Strength;IADL    Rehab Potential  Good    Clinical Decision Making  Several treatment options, min-mod task modification necessary    Comorbidities Affecting Occupational Performance:  None    Modification or Assistance to Complete Evaluation   No modification of tasks or assist necessary to complete eval    OT Frequency  2x / week    OT Duration  6 weeks    OT Treatment/Interventions  Self-care/ADL training;Therapeutic exercise;Patient/family education;Splinting;Paraffin;Fluidtherapy;Contrast Bath;Ultrasound;Manual Therapy;Passive range of motion    Plan  assess progress with HEP and modify as needed    OT Home Exercise Plan  see pt instruction    Consulted and Agree with Plan of Care  Patient       Patient will benefit from skilled therapeutic intervention in order to improve the following deficits and impairments:   Body Structure / Function / Physical Skills: ADL, Decreased knowledge of precautions,  Flexibility, ROM, UE functional use, FMC, Muscle spasms, Sensation, Edema, Pain, Strength, IADL       Visit Diagnosis: Pain in right arm  Numbness and tingling in right hand  Stiffness of right hand, not elsewhere classified  Stiffness of right wrist, not elsewhere classified  Cervicalgia  Muscle weakness (generalized)    Problem List There are no problems to display for this patient.   Rosalyn Gess OTR/L,CLT 06/08/2019, 4:28 PM  [facility] [facility] [address] [place], [address] Phone: [phone]   Fax:  [phone]  Name: Louis Reyes MRN: [id number] Date of Birth: 1955-11-30

## 2019-06-11 ENCOUNTER — Ambulatory Visit: Payer: Worker's Compensation | Admitting: Occupational Therapy

## 2019-06-11 ENCOUNTER — Other Ambulatory Visit: Payer: Self-pay

## 2019-06-11 DIAGNOSIS — M79601 Pain in right arm: Secondary | ICD-10-CM | POA: Diagnosis not present

## 2019-06-11 DIAGNOSIS — M25631 Stiffness of right wrist, not elsewhere classified: Secondary | ICD-10-CM

## 2019-06-11 DIAGNOSIS — M6281 Muscle weakness (generalized): Secondary | ICD-10-CM

## 2019-06-11 DIAGNOSIS — M542 Cervicalgia: Secondary | ICD-10-CM

## 2019-06-11 DIAGNOSIS — R200 Anesthesia of skin: Secondary | ICD-10-CM

## 2019-06-11 DIAGNOSIS — M25641 Stiffness of right hand, not elsewhere classified: Secondary | ICD-10-CM

## 2019-06-11 NOTE — Patient Instructions (Signed)
Same HEP but add tapping of digits after CT spreads and prior to tendon glides

## 2019-06-11 NOTE — Therapy (Signed)
[facility] [address] [place], [address] Phone: [phone]   Fax:  [phone]  Occupational Therapy Treatment  Patient Details  Name: Louis Reyes MRN: [id number] Date of Birth: 12-13-1955 Referring Provider (OT): Dr Harlin Heys   Encounter Date: 06/11/2019  OT End of Session - 06/11/19 1324    Visit Number  4    Number of Visits  12    Date for OT Re-Evaluation  07/13/19    Authorization Type  workers comp    Authorization - Visit Number  4    Authorization - Number of Visits  8    OT Start Time  1147    OT Stop Time  1230    OT Time Calculation (min)  43 min    Activity Tolerance  Patient tolerated treatment well    Behavior During Therapy  [facility] for tasks assessed/performed       Past Medical History:  Diagnosis Date  . Family history of adverse reaction to anesthesia    Father  . History of kidney stones   . Hypertension   . Pre-diabetes    diet controlled    Past Surgical History:  Procedure Laterality Date  . COLONOSCOPY WITH PROPOFOL N/A 02/12/2018   Procedure: COLONOSCOPY WITH PROPOFOL;  Surgeon: Toledo, Benay Pike, MD;  Location: [facility];  Service: Gastroenterology;  Laterality: N/A;  . HERNIA REPAIR  04/29/2017  . INGUINAL HERNIA REPAIR N/A 04/29/2017   Procedure: HERNIA REPAIR INGUINAL ADULT;  Surgeon: Herbert Pun, MD;  Location: [facility] OR[facility];  Service: General;  Laterality: N/A;    There were no vitals filed for this visit.  Subjective Assessment - 06/11/19 1148    Subjective   I seen the Dr Tuesday and he gave me a shot in my wrist - here - still doing better in my neck and hand pain and numbness    Pertinent History  Pt injury occur on 04/30/2019 - arm was pulled into machine and sqeeze - resulting in pain in R hand to shoulder/upper traps , numbness thumb thru 4th digit- swelling - xrays was negative at ER on 11/5 - seen ortho on 11/17 - refer to  therapy    Patient Stated Goals  Want the pain, numbness in my R arm better so I can use my hand in my every day activiities and do my job    Currently in Pain?  Yes    Pain Score  4     Pain Location  --   R upper traps   Pain Orientation  Right    Pain Descriptors / Indicators  Aching;Tightness    Pain Type  Acute pain    Pain Onset  More than a month ago        numbness improving- more distal on 2nd digit (PIP to DIP ) , 3rd from [facility] , and thumb the IP - and no numbness in 4th /5th  no numbness in palm  Pain better in volar wrist and forearm - as well as upper traps  About 4/10 at the worse on upper traps  Cervical AROM improve in all planes and very little pull             OT Treatments/Exercises (OP) - 06/11/19 0001      Moist Heat Therapy   Number Minutes Moist Heat  5 Minutes    Moist Heat Location  --   R upper traps while doing contrast  RUE Contrast Bath   Time  9 minutes    Comments  contrast to R hand and wrist prior to soft tissue        Graston tool nr 2 for brushing and sweeping over volar wrist and forearm And MC And Carpal spreads prior to AROM - numbness improve in middle digits after graston  Graston tool nr 4 over upper traps - sweeping on R  Prior to cervical AROM in all planes Lateral flexion L and R Scalenes stretches  Cervical rotation L and R  10reps               Tapping of digits done and add to HEP prior to tendon glides   Tendon glides done - AROM pain free and do not force - 10 reps\  Opposition to all digits 5 reps - no pain  AROM for RD, UD ,wrist flexion , ext 10 reps pain free Med N glide review - only 5 reps 2 x day  carpal spreads wife or grand daughter are doing at home per pt   Reinforce again  Keep pain under 2/10  No increase numbness with HEP  No pressure thru palm or gripping objects tight    OT Education - 06/11/19 1324    Education Details  progress and nerve healing/HEP changes    Person(s)  Educated  Patient    Methods  Explanation;Demonstration;Tactile cues;Verbal cues;Handout    Comprehension  Verbalized understanding;Returned demonstration       OT Short Term Goals - 06/01/19 1632      OT SHORT TERM GOAL #1   Title  Pain on PRWHE improve with more than 20 points    Baseline  pain on PRWHE with eval 31/50    Time  3    Period  Weeks    Status  New    Target Date  06/22/19      OT SHORT TERM GOAL #2   Title  Pt to be independent in HEP to decrease pain , numbness and increase ROM /strength    Baseline  not knowledge on HEP    Time  3    Period  Weeks    Status  New    Target Date  06/22/19        OT Long Term Goals - 06/01/19 1635      OT LONG TERM GOAL #1   Title  R wrist and digits AROM in functional tasks increase of symptoms less than 2/10    Baseline  pain at rest volar wrist and forearm 6/10 , increase to 9/10 ; numbness increase during day    Time  4    Period  Weeks    Status  New    Target Date  06/29/19      OT LONG TERM GOAL #2   Title  R upper trap tightness and pain improve for pt to show cervical AROM WNL with pain less than 2/10    Baseline  tenderness , pain 4/10 R upper trap - decrease cervical  rotation , lat flexion both  ways    Time  6    Period  Weeks    Status  New    Target Date  07/13/19      OT LONG TERM GOAL #3   Title  R grip and prehension strength improve to more than 50% without increase symptoms to use hand with more ease in pushing up , carry more than 5 lbs , cut  food    Baseline  Grip R 30 ,L 80 ; lat grip R 8; L 19 lbs , 3 point R 5, L 16 lbs - and functional use 28%    Time  6    Period  Weeks    Status  New    Target Date  07/13/19      OT LONG TERM GOAL #4   Title  Function score on PRWHE improve with more than 20 points    Baseline  Function score on PRWHE 37/50 at eval    Time  6    Period  Weeks    Status  New    Target Date  07/13/19            Plan - 06/11/19 1325    Clinical Impression  Statement  Pt is about 6 wks out from crushing injury to R arm - bening pulled in machine and squeezed - xrays for fx was negative - pt cont to show decrease pain in R upper traps, wrist and hand , decrease numbness and increase strength- pt did see MD and had shot in volar wrist ( CT)    OT Occupational Profile and History  Problem Focused Assessment - Including review of records relating to presenting problem    Occupational performance deficits (Please refer to evaluation for details):  ADL's;IADL's;Work;Play;Leisure;Social Participation    Body Structure / Function / Physical Skills  ADL;Decreased knowledge of precautions;Flexibility;ROM;UE functional use;FMC;Muscle spasms;Sensation;Edema;Pain;Strength;IADL    Rehab Potential  Good    Clinical Decision Making  Several treatment options, min-mod task modification necessary    Comorbidities Affecting Occupational Performance:  None    Modification or Assistance to Complete Evaluation   No modification of tasks or assist necessary to complete eval    OT Frequency  2x / week    OT Duration  4 weeks    OT Treatment/Interventions  Self-care/ADL training;Therapeutic exercise;Patient/family education;Splinting;Paraffin;Fluidtherapy;Contrast Bath;Ultrasound;Manual Therapy;Passive range of motion    Plan  assess progress with HEP and modify as needed    OT Home Exercise Plan  see pt instruction    Consulted and Agree with Plan of Care  Patient       Patient will benefit from skilled therapeutic intervention in order to improve the following deficits and impairments:   Body Structure / Function / Physical Skills: ADL, Decreased knowledge of precautions, Flexibility, ROM, UE functional use, FMC, Muscle spasms, Sensation, Edema, Pain, Strength, IADL       Visit Diagnosis: Pain in right arm  Numbness and tingling in right hand  Stiffness of right hand, not elsewhere classified  Stiffness of right wrist, not elsewhere  classified  Cervicalgia  Muscle weakness (generalized)    Problem List There are no problems to display for this patient.   Rosalyn Gess  OTR/L,CLT 06/11/2019, 1:27 PM  [place] [facility] [address], [place], [address] Phone: [phone]   Fax:  [phone]  Name: Louis Reyes MRN: [id number] Date of Birth: [birth year]

## 2019-06-15 ENCOUNTER — Other Ambulatory Visit: Payer: Self-pay

## 2019-06-15 ENCOUNTER — Ambulatory Visit: Payer: Worker's Compensation | Admitting: Occupational Therapy

## 2019-06-15 DIAGNOSIS — R202 Paresthesia of skin: Secondary | ICD-10-CM

## 2019-06-15 DIAGNOSIS — M79601 Pain in right arm: Secondary | ICD-10-CM | POA: Diagnosis not present

## 2019-06-15 DIAGNOSIS — R200 Anesthesia of skin: Secondary | ICD-10-CM

## 2019-06-15 DIAGNOSIS — M542 Cervicalgia: Secondary | ICD-10-CM

## 2019-06-15 DIAGNOSIS — M6281 Muscle weakness (generalized): Secondary | ICD-10-CM

## 2019-06-15 DIAGNOSIS — M25631 Stiffness of right wrist, not elsewhere classified: Secondary | ICD-10-CM

## 2019-06-15 DIAGNOSIS — M25641 Stiffness of right hand, not elsewhere classified: Secondary | ICD-10-CM

## 2019-06-15 NOTE — Patient Instructions (Signed)
Same HEP  

## 2019-06-15 NOTE — Therapy (Signed)
[place] [address], [place], [address] Phone: [phone]   Fax:  [phone]  Occupational Therapy Treatment  Patient Details  Name: Louis Reyes MRN: [id number] Date of Birth: [birth year] Referring Provider (OT): Dr Harlin Heys   Encounter Date: 06/15/2019  OT End of Session - 06/15/19 1839    Visit Number  5    Number of Visits  12    Date for OT Re-Evaluation  07/13/19    Authorization Type  workers comp    Authorization - Visit Number  5    Authorization - Number of Visits  8    OT Start Time  1150    OT Stop Time  1235    OT Time Calculation (min)  45 min    Activity Tolerance  Patient tolerated treatment well    Behavior During Therapy  [facility] for tasks assessed/performed       Past Medical History:  Diagnosis Date  . Family history of adverse reaction to anesthesia    Father  . History of kidney stones   . Hypertension   . Pre-diabetes    diet controlled    Past Surgical History:  Procedure Laterality Date  . COLONOSCOPY WITH PROPOFOL N/A 02/12/2018   Procedure: COLONOSCOPY WITH PROPOFOL;  Surgeon: Toledo, Benay Pike, MD;  Location: [facility];  Service: Gastroenterology;  Laterality: N/A;  . HERNIA REPAIR  04/29/2017  . INGUINAL HERNIA REPAIR N/A 04/29/2017   Procedure: HERNIA REPAIR INGUINAL ADULT;  Surgeon: Herbert Pun, MD;  Location: [facility] ORS;  Service: General;  Laterality: N/A;    There were no vitals filed for this visit.  Subjective Assessment - 06/15/19 1714    Subjective   I have still pins and needles in middle finger -but less in index and thumb - but able to do my buttons now - but trouble to pick up small objects and anything heavy    Pertinent History  Pt injury occur on 04/30/2019 - arm was pulled into machine and sqeeze - resulting in pain in R hand to shoulder/upper traps , numbness thumb thru 4th digit- swelling - xrays was negative at ER on  11/5 - seen ortho on 11/17 - refer to therapy    Patient Stated Goals  Want the pain, numbness in my R arm better so I can use my hand in my every day activiities and do my job    Currently in Pain?  Yes    Pain Score  4     Pain Location  --   gripping objects   Pain Descriptors / Indicators  Aching;Tightness    Pain Type  Acute pain    Pain Onset  More than a month ago         [facility] OT Assessment - 06/15/19 0001      Strength   Right Hand Grip (lbs)  64    Right Hand Lateral Pinch  17 lbs    Right Hand 3 Point Pinch  12 lbs    Left Hand Grip (lbs)  80    Left Hand Lateral Pinch  19 lbs    Left Hand 3 Point Pinch  16 lbs        Grip improved but prehension about the same Numbness still in 3rd digit - more pins and needles than numbness -thumb and 2nd digit more distally -and testing normal on [employer] - but 3rd testing 2nd level - diminished sensation  Cervical AROM improve in all planesand very little pull       OT Treatments/Exercises (OP) - 06/15/19 0001      Moist Heat Therapy   Number Minutes Moist Heat  6 Minutes    Moist Heat Location  --   R Upper traps prior to soft tissue      RUE Contrast Bath   Time  9 minutes    Comments  contrast to R hand and wrist prior to soft tissue          Graston tool nr 2 for brushing and sweeping over volar wrist and forearm And MC And Carpal spreads prior to AROM- numbness improve in middle digits after graston Graston tool nr 4 over upper traps - sweeping on R  Prior to cervical AROM in all planes Lateral flexion L and R Scalenes stretches  Cervical rotation L and R  10reps              Tapping of digits to do at home  prior to tendon glides   Tendon glidesdone- AROM pain free and do not force - 10 reps\ Opposition to all digits 5 reps - no pain AROM for RD, UD ,wrist flexion , ext 10 reps pain free Med N glide review - only 5 reps 2 x day carpal spreads wife or grand daughter are doing at  home per pt  Reinforce again  Keep pain under 2/10  No increase numbness with HEP  No pressure thru palm or gripping  Can do dexerity for [facility] - in hand manipulation - pick upt to palm and release one at time      OT Education - 06/15/19 1839    Education Details  progress and nerve healing/HEP changes    Person(s) Educated  Patient    Methods  Explanation;Demonstration;Tactile cues;Verbal cues;Handout    Comprehension  Verbalized understanding;Returned demonstration       OT Short Term Goals - 06/01/19 1632      OT SHORT TERM GOAL #1   Title  Pain on PRWHE improve with more than 20 points    Baseline  pain on PRWHE with eval 31/50    Time  3    Period  Weeks    Status  New    Target Date  06/22/19      OT SHORT TERM GOAL #2   Title  Pt to be independent in HEP to decrease pain , numbness and increase ROM /strength    Baseline  not knowledge on HEP    Time  3    Period  Weeks    Status  New    Target Date  06/22/19        OT Long Term Goals - 06/01/19 1635      OT LONG TERM GOAL #1   Title  R wrist and digits AROM in functional tasks increase of symptoms less than 2/10    Baseline  pain at rest volar wrist and forearm 6/10 , increase to 9/10 ; numbness increase during day    Time  4    Period  Weeks    Status  New    Target Date  06/29/19      OT LONG TERM GOAL #2   Title  R upper trap tightness and pain improve for pt to show cervical AROM WNL with pain less than 2/10    Baseline  tenderness , pain 4/10 R upper trap - decrease cervical  rotation , lat  flexion both  ways    Time  6    Period  Weeks    Status  New    Target Date  07/13/19      OT LONG TERM GOAL #3   Title  R grip and prehension strength improve to more than 50% without increase symptoms to use hand with more ease in pushing up , carry more than 5 lbs , cut food    Baseline  Grip R 30 ,L 80 ; lat grip R 8; L 19 lbs , 3 point R 5, L 16 lbs - and functional use 28%    Time  6    Period   Weeks    Status  New    Target Date  07/13/19      OT LONG TERM GOAL #4   Title  Function score on PRWHE improve with more than 20 points    Baseline  Function score on PRWHE 37/50 at eval    Time  6    Period  Weeks    Status  New    Target Date  07/13/19            Plan - 06/15/19 1910    Clinical Impression Statement  Pt is about 6 1/2 wks out from crushing injury to R arm - plled into machine and squeezed - xray showed no fx - pt showing improvement in AROM , strength and cervical AROM - cont to have sensation changes in thumb thru 3rd the worse -semmes weinstein test normal on thumb and 2nd - but 3rd 2nd level - Positive tinel at volar wrist - but pain better- grip increase but 3 point grip about same    OT Occupational Profile and History  Problem Focused Assessment - Including review of records relating to presenting problem    Occupational performance deficits (Please refer to evaluation for details):  ADL's;IADL's;Work;Play;Leisure;Social Participation    Body Structure / Function / Physical Skills  ADL;Decreased knowledge of precautions;Flexibility;ROM;UE functional use;FMC;Muscle spasms;Sensation;Edema;Pain;Strength;IADL    Rehab Potential  Good    Clinical Decision Making  Several treatment options, min-mod task modification necessary    Comorbidities Affecting Occupational Performance:  None    Modification or Assistance to Complete Evaluation   No modification of tasks or assist necessary to complete eval    OT Frequency  2x / week    OT Duration  4 weeks    OT Treatment/Interventions  Self-care/ADL training;Therapeutic exercise;Patient/family education;Splinting;Paraffin;Fluidtherapy;Contrast Bath;Ultrasound;Manual Therapy;Passive range of motion    Plan  assess progress with HEP and modify as needed    OT Home Exercise Plan  see pt instruction    Consulted and Agree with Plan of Care  Patient       Patient will benefit from skilled therapeutic intervention in  order to improve the following deficits and impairments:   Body Structure / Function / Physical Skills: ADL, Decreased knowledge of precautions, Flexibility, ROM, UE functional use, FMC, Muscle spasms, Sensation, Edema, Pain, Strength, IADL       Visit Diagnosis: Pain in right arm  Numbness and tingling in right hand  Stiffness of right hand, not elsewhere classified  Stiffness of right wrist, not elsewhere classified  Cervicalgia  Muscle weakness (generalized)    Problem List There are no problems to display for this patient.   Rosalyn Gess OTR/L,CLT 06/15/2019, 7:13 PM  [place] [facility] [address], [place], [address] Phone: [phone]  Fax:  [phone]  Name: Louis Reyes MRN: [id number] Date of Birth: 25-May-1956

## 2019-06-23 ENCOUNTER — Ambulatory Visit: Payer: Worker's Compensation | Admitting: Occupational Therapy

## 2019-06-23 ENCOUNTER — Other Ambulatory Visit: Payer: Self-pay

## 2019-06-23 DIAGNOSIS — M25641 Stiffness of right hand, not elsewhere classified: Secondary | ICD-10-CM

## 2019-06-23 DIAGNOSIS — M79601 Pain in right arm: Secondary | ICD-10-CM | POA: Diagnosis not present

## 2019-06-23 DIAGNOSIS — M25631 Stiffness of right wrist, not elsewhere classified: Secondary | ICD-10-CM

## 2019-06-23 DIAGNOSIS — M542 Cervicalgia: Secondary | ICD-10-CM

## 2019-06-23 DIAGNOSIS — M6281 Muscle weakness (generalized): Secondary | ICD-10-CM

## 2019-06-23 DIAGNOSIS — R200 Anesthesia of skin: Secondary | ICD-10-CM

## 2019-06-23 NOTE — Patient Instructions (Signed)
Same HEP  

## 2019-06-23 NOTE — Therapy (Signed)
[place] [facility] [address] [place], [address] Phone: [phone]   Fax:  [phone]  Occupational Therapy Treatment  Patient Details  Name: Louis Reyes MRN: [id number] Date of Birth: 1956/05/08 Referring Provider (OT): Dr Harlin Heys   Encounter Date: 06/23/2019  OT End of Session - 06/23/19 0745    Visit Number  6    Number of Visits  12    Date for OT Re-Evaluation  07/13/19    Authorization Type  workers comp    Authorization - Visit Number  6    Authorization - Number of Visits  8    OT Start Time  0732    OT Stop Time  0813    OT Time Calculation (min)  41 min    Activity Tolerance  Patient tolerated treatment well    Behavior During Therapy  [facility] for tasks assessed/performed       Past Medical History:  Diagnosis Date  . Family history of adverse reaction to anesthesia    Father  . History of kidney stones   . Hypertension   . Pre-diabetes    diet controlled    Past Surgical History:  Procedure Laterality Date  . COLONOSCOPY WITH PROPOFOL N/A 02/12/2018   Procedure: COLONOSCOPY WITH PROPOFOL;  Surgeon: Toledo, Benay Pike, MD;  Location: [facility];  Service: Gastroenterology;  Laterality: N/A;  . HERNIA REPAIR  04/29/2017  . INGUINAL HERNIA REPAIR N/A 04/29/2017   Procedure: HERNIA REPAIR INGUINAL ADULT;  Surgeon: Herbert Pun, MD;  Location: [facility] ORS;  Service: General;  Laterality: N/A;    There were no vitals filed for this visit.  Subjective Assessment - 06/23/19 0743    Subjective   Numbness in middle finger is better and pain - not the whole hand into the palm - and neck better - just stiff this morning in the neck    Pertinent History  Pt injury occur on 04/30/2019 - arm was pulled into machine and sqeeze - resulting in pain in R hand to shoulder/upper traps , numbness thumb thru 4th digit- swelling - xrays was negative at ER on 11/5 - seen ortho on 11/17 -  refer to therapy    Patient Stated Goals  Want the pain, numbness in my R arm better so I can use my hand in my every day activiities and do my job    Currently in Pain?  Yes    Pain Score  4     Pain Location  Wrist    Pain Orientation  Right    Pain Descriptors / Indicators  Pins and needles    Pain Type  Acute pain                   OT Treatments/Exercises (OP) - 06/23/19 0001      Moist Heat Therapy   Number Minutes Moist Heat  8 Minutes    Moist Heat Location  --   R upper traps prior to soft tissue and AROM      Ultrasound   Ultrasound Location  volar wrist R     Ultrasound Parameters  3,3MHZ, 20 %, 1.0 intensity     Ultrasound Goals  Edema;Pain      RUE Contrast Bath   Time  9 minutes    Comments  contrast prior to soft tissue      Graston tool nr 2 for brushing and sweeping over volar wrist and forearm And MC  And Carpal spreads prior to AROM- numbness improve in middle digit after graston Graston tool nr 4 over upper traps - sweeping on R  Prior to cervical AROM in all planes Lateral flexion L and R Scalenes stretches  Cervical rotation L and R  10reps Tapping of digits to do at home  prior to tendon glides Tendon glidesdone- AROM pain free and do not force - 10 reps\ Opposition to all digits 5 reps - no pain AROM for RD, UD ,wrist flexion , ext 10 reps pain free Med N glide review - only 5 reps 2 x day carpal spreads wife or grand daughter are doing at home per pt US done this date at end of session  Reinforce again  Keep pain under 2/10  No increase numbness with HEP  No pressure thru palm or gripping           OT Education - 06/23/19 0745    Education Details  progress and nerve healing/HEP changes    Person(s) Educated  Patient    Methods  Explanation;Demonstration;Tactile cues;Verbal cues;Handout    Comprehension  Verbalized understanding;Returned demonstration       OT Short Term Goals - 06/01/19  1632      OT SHORT TERM GOAL #1   Title  Pain on PRWHE improve with more than 20 points    Baseline  pain on PRWHE with eval 31/50    Time  3    Period  Weeks    Status  New    Target Date  06/22/19      OT SHORT TERM GOAL #2   Title  Pt to be independent in HEP to decrease pain , numbness and increase ROM /strength    Baseline  not knowledge on HEP    Time  3    Period  Weeks    Status  New    Target Date  06/22/19        OT Long Term Goals - 06/01/19 1635      OT LONG TERM GOAL #1   Title  R wrist and digits AROM in functional tasks increase of symptoms less than 2/10    Baseline  pain at rest volar wrist and forearm 6/10 , increase to 9/10 ; numbness increase during day    Time  4    Period  Weeks    Status  New    Target Date  06/29/19      OT LONG TERM GOAL #2   Title  R upper trap tightness and pain improve for pt to show cervical AROM WNL with pain less than 2/10    Baseline  tenderness , pain 4/10 R upper trap - decrease cervical  rotation , lat flexion both  ways    Time  6    Period  Weeks    Status  New    Target Date  07/13/19      OT LONG TERM GOAL #3   Title  R grip and prehension strength improve to more than 50% without increase symptoms to use hand with more ease in pushing up , carry more than 5 lbs , cut food    Baseline  Grip R 30 ,L 80 ; lat grip R 8; L 19 lbs , 3 point R 5, L 16 lbs - and functional use 28%    Time  6    Period  Weeks    Status  New    Target Date  07/13/19  OT LONG TERM GOAL #4   Title  Function score on PRWHE improve with more than 20 points    Baseline  Function score on PRWHE 37/50 at eval    Time  6    Period  Weeks    Status  New    Target Date  07/13/19            Plan - 06/23/19 0745    Clinical Impression Statement  Pt is 7 12 wks out from crushing injury to R arm - hand got pulled into machine and squeezed - pt show increase AROM , strength in R hand and wrist - cont to have some pins and needles in  thumb thru 3rd but per pt improving more distal - numbness only in DIP of 3rd and pins and needles down to palm - pain decrease in volar wrist - cervical AROM improving    OT Occupational Profile and History  Problem Focused Assessment - Including review of records relating to presenting problem    Occupational performance deficits (Please refer to evaluation for details):  ADL's;IADL's;Work;Play;Leisure;Social Participation    Body Structure / Function / Physical Skills  ADL;Decreased knowledge of precautions;Flexibility;ROM;UE functional use;FMC;Muscle spasms;Sensation;Edema;Pain;Strength;IADL    Rehab Potential  Good    Clinical Decision Making  Several treatment options, min-mod task modification necessary    Comorbidities Affecting Occupational Performance:  None    Modification or Assistance to Complete Evaluation   No modification of tasks or assist necessary to complete eval    OT Frequency  2x / week    OT Duration  4 weeks    OT Treatment/Interventions  Self-care/ADL training;Therapeutic exercise;Patient/family education;Splinting;Paraffin;Fluidtherapy;Contrast Bath;Ultrasound;Manual Therapy;Passive range of motion    Plan  assess progress with HEP and modify as needed    OT Home Exercise Plan  see pt instruction    Consulted and Agree with Plan of Care  Patient       Patient will benefit from skilled therapeutic intervention in order to improve the following deficits and impairments:   Body Structure / Function / Physical Skills: ADL, Decreased knowledge of precautions, Flexibility, ROM, UE functional use, FMC, Muscle spasms, Sensation, Edema, Pain, Strength, IADL       Visit Diagnosis: Pain in right arm  Numbness and tingling in right hand  Stiffness of right hand, not elsewhere classified  Stiffness of right wrist, not elsewhere classified  Cervicalgia  Muscle weakness (generalized)    Problem List There are no problems to display for this patient.   Rosalyn Gess OTR/l,CLT 06/23/2019, 12:31 PM  [place] [address] [place], [address] Phone: [phone]   Fax:  [phone]  Name: Louis Reyes MRN: [id number] Date of Birth: 02-Oct-1955

## 2019-06-25 ENCOUNTER — Ambulatory Visit: Payer: Worker's Compensation | Admitting: Occupational Therapy

## 2019-06-25 ENCOUNTER — Other Ambulatory Visit: Payer: Self-pay

## 2019-06-25 DIAGNOSIS — M6281 Muscle weakness (generalized): Secondary | ICD-10-CM

## 2019-06-25 DIAGNOSIS — R202 Paresthesia of skin: Secondary | ICD-10-CM

## 2019-06-25 DIAGNOSIS — R200 Anesthesia of skin: Secondary | ICD-10-CM

## 2019-06-25 DIAGNOSIS — M542 Cervicalgia: Secondary | ICD-10-CM

## 2019-06-25 DIAGNOSIS — M79601 Pain in right arm: Secondary | ICD-10-CM | POA: Diagnosis not present

## 2019-06-25 DIAGNOSIS — M25631 Stiffness of right wrist, not elsewhere classified: Secondary | ICD-10-CM

## 2019-06-25 DIAGNOSIS — M25641 Stiffness of right hand, not elsewhere classified: Secondary | ICD-10-CM

## 2019-06-25 NOTE — Patient Instructions (Signed)
Same HEP  

## 2019-06-25 NOTE — Therapy (Signed)
[place] [facility] [address], [place], [address] Phone: [phone]   Fax:  [phone]  Occupational Therapy Treatment  Patient Details  Name: Louis Reyes MRN: [id number] Date of Birth: 09/24/1955 Referring Provider (OT): Dr Harlin Heys   Encounter Date: 06/25/2019  OT End of Session - 06/25/19 1245    Visit Number  7    Number of Visits  12    Date for OT Re-Evaluation  07/13/19    Authorization Type  workers comp    Authorization - Visit Number  7    Authorization - Number of Visits  8    OT Start Time  1002    OT Stop Time  1046    OT Time Calculation (min)  44 min    Activity Tolerance  Patient tolerated treatment well    Behavior During Therapy  [facility] for tasks assessed/performed       Past Medical History:  Diagnosis Date  . Family history of adverse reaction to anesthesia    Father  . History of kidney stones   . Hypertension   . Pre-diabetes    diet controlled    Past Surgical History:  Procedure Laterality Date  . COLONOSCOPY WITH PROPOFOL N/A 02/12/2018   Procedure: COLONOSCOPY WITH PROPOFOL;  Surgeon: Toledo, Benay Pike, MD;  Location: [facility] ENDOSCOPY;  Service: Gastroenterology;  Laterality: N/A;  . HERNIA REPAIR  04/29/2017  . INGUINAL HERNIA REPAIR N/A 04/29/2017   Procedure: HERNIA REPAIR INGUINAL ADULT;  Surgeon: Herbert Pun, MD;  Location: [facility] ORS;  Service: General;  Laterality: N/A;    There were no vitals filed for this visit.  Subjective Assessment - 06/25/19 1242    Subjective   My tips of my thumb and index more tingling and then my middle finger more numb at tip and base pins and needles - only little pull when turning my head to look over my L shoulder    Pertinent History  Pt injury occur on 04/30/2019 - arm was pulled into machine and sqeeze - resulting in pain in R hand to shoulder/upper traps , numbness thumb thru 4th digit- swelling - xrays was  negative at ER on 11/5 - seen ortho on 11/17 - refer to therapy    Patient Stated Goals  Want the pain, numbness in my R arm better so I can use my hand in my every day activiities and do my job    Currently in Pain?  Yes    Pain Score  4     Pain Location  --   R upper traps   Pain Orientation  Right    Pain Descriptors / Indicators  Tightness    Pain Type  Acute pain    Pain Onset  More than a month ago    Aggravating Factors   looking over L shoulder         [facility] OT Assessment - 06/25/19 0001      Strength   Right Hand Grip (lbs)  64    Right Hand Lateral Pinch  19 lbs    Right Hand 3 Point Pinch  17 lbs    Left Hand Grip (lbs)  80    Left Hand Lateral Pinch  19 lbs    Left Hand 3 Point Pinch  16 lbs       great progress in prehension strength but grip same  Report only in DIP of thumb and 2nd digit tingling  Discuss with pt MD order for only 5 lbs limit - pt was asking about picking up 50 lbs  Pt to avoid any tight grip, pushing or weight bearing thru palm  Reinforce healing of nerve - progression and precautions       OT Treatments/Exercises (OP) - 06/25/19 0001      Moist Heat Therapy   Number Minutes Moist Heat  8 Minutes    Moist Heat Location  --   R upper traps prior to ROM      Ultrasound   Ultrasound Location  volar wrist R and upper traps R trigger point     Ultrasound Parameters  3.3MHZ 20 , 1.0     Ultrasound Goals  Edema;Pain      RUE Contrast Bath   Time  9 minutes    Comments  contrast to R hand and wrist prior to soft tissue      Graston tool nr 2 for brushing and sweeping over volar wrist and forearm And MC And Carpal spreads prior to AROM- numbness improve in middle digit Tinel over [facility] of 3rd - numbness at DIP  Graston tool nr 4 over upper traps - sweeping on R  Prior to cervical AROM in all planes Lateral flexion L and R Scalenes stretches  Cervical rotation L and R  10 reps  Tapping of digitsto do at  homeprior to tendon glides Tendon glidesdone- AROM pain free and do not force - 10 reps\ Opposition to all digits 5 reps - no pain AROM for RD, UD ,wrist flexion , ext 10 reps pain free Med N glide review - only 5 reps 2 x day carpal spreads wife or grand daughter are doing at home per pt US done this date at end of session to upper trap trigger point and volar wrist   Reinforce again   No increase numbness with HEP  No pressure thru palm or grippingmore than 5 lbs         OT Education - 06/25/19 1245    Education Details  progress , limitations and POC    Person(s) Educated  Patient    Methods  Explanation;Demonstration;Tactile cues;Verbal cues;Handout    Comprehension  Verbalized understanding;Returned demonstration       OT Short Term Goals - 06/01/19 1632      OT SHORT TERM GOAL #1   Title  Pain on PRWHE improve with more than 20 points    Baseline  pain on PRWHE with eval 31/50    Time  3    Period  Weeks    Status  New    Target Date  06/22/19      OT SHORT TERM GOAL #2   Title  Pt to be independent in HEP to decrease pain , numbness and increase ROM /strength    Baseline  not knowledge on HEP    Time  3    Period  Weeks    Status  New    Target Date  06/22/19        OT Long Term Goals - 06/01/19 1635      OT LONG TERM GOAL #1   Title  R wrist and digits AROM in functional tasks increase of symptoms less than 2/10    Baseline  pain at rest volar wrist and forearm 6/10 , increase to 9/10 ; numbness increase during day    Time  4    Period  Weeks    Status  New  Target Date  06/29/19      OT LONG TERM GOAL #2   Title  R upper trap tightness and pain improve for pt to show cervical AROM WNL with pain less than 2/10    Baseline  tenderness , pain 4/10 R upper trap - decrease cervical  rotation , lat flexion both  ways    Time  6    Period  Weeks    Status  New    Target Date  07/13/19      OT LONG TERM GOAL #3   Title  R grip and  prehension strength improve to more than 50% without increase symptoms to use hand with more ease in pushing up , carry more than 5 lbs , cut food    Baseline  Grip R 30 ,L 80 ; lat grip R 8; L 19 lbs , 3 point R 5, L 16 lbs - and functional use 28%    Time  6    Period  Weeks    Status  New    Target Date  07/13/19      OT LONG TERM GOAL #4   Title  Function score on PRWHE improve with more than 20 points    Baseline  Function score on PRWHE 37/50 at eval    Time  6    Period  Weeks    Status  New    Target Date  07/13/19            Plan - 06/25/19 1246    Clinical Impression Statement  Pt is about 8 wks out from crushing injury to R arm - hand got pulled into machine and squeezed - pt show AROM WNL hand and wrist and no pain - tinel over [facility] for 3rd digit now - numbness slowly progressing , only pull feeling over R upper traps with cervical rotation to the L - pt lat and 3 point grip increase greatly - grip still same  - pt can benefit from cont OT servies - reinforce again wiht pt to not pick up more than 5 lbs and pushing thru palm    OT Occupational Profile and History  Problem Focused Assessment - Including review of records relating to presenting problem    Occupational performance deficits (Please refer to evaluation for details):  ADL's;IADL's;Work;Play;Leisure;Social Participation    Body Structure / Function / Physical Skills  ADL;Decreased knowledge of precautions;Flexibility;ROM;UE functional use;FMC;Muscle spasms;Sensation;Edema;Pain;Strength;IADL    Rehab Potential  Good    Clinical Decision Making  Several treatment options, min-mod task modification necessary    Comorbidities Affecting Occupational Performance:  None    Modification or Assistance to Complete Evaluation   No modification of tasks or assist necessary to complete eval    OT Frequency  2x / week    OT Duration  4 weeks    OT Treatment/Interventions  Self-care/ADL training;Therapeutic  exercise;Patient/family education;Splinting;Paraffin;Fluidtherapy;Contrast Bath;Ultrasound;Manual Therapy;Passive range of motion    Plan  assess progress with HEP and modify as needed    OT Home Exercise Plan  see pt instruction       Patient will benefit from skilled therapeutic intervention in order to improve the following deficits and impairments:   Body Structure / Function / Physical Skills: ADL, Decreased knowledge of precautions, Flexibility, ROM, UE functional use, FMC, Muscle spasms, Sensation, Edema, Pain, Strength, IADL       Visit Diagnosis: Pain in right arm  Stiffness of right hand, not elsewhere classified  Stiffness of  right wrist, not elsewhere classified  Cervicalgia  Muscle weakness (generalized)  Numbness and tingling in right hand    Problem List There are no problems to display for this patient.   Rosalyn Gess OTR/L,CLT 06/25/2019, 12:49 PM  [place] [facility] [address] [place], [address] Phone: [phone]   Fax:  [phone]  Name: Louis Reyes MRN: [id number] Date of Birth: 1955/09/28

## 2019-07-03 ENCOUNTER — Ambulatory Visit: Payer: Worker's Compensation | Admitting: Occupational Therapy

## 2019-07-23 ENCOUNTER — Ambulatory Visit: Payer: Worker's Compensation | Attending: Orthopaedic Surgery | Admitting: Occupational Therapy

## 2019-07-23 ENCOUNTER — Other Ambulatory Visit: Payer: Self-pay

## 2019-07-23 DIAGNOSIS — M6281 Muscle weakness (generalized): Secondary | ICD-10-CM | POA: Diagnosis present

## 2019-07-23 DIAGNOSIS — M79601 Pain in right arm: Secondary | ICD-10-CM

## 2019-07-23 DIAGNOSIS — M542 Cervicalgia: Secondary | ICD-10-CM

## 2019-07-23 DIAGNOSIS — M25641 Stiffness of right hand, not elsewhere classified: Secondary | ICD-10-CM | POA: Diagnosis present

## 2019-07-23 DIAGNOSIS — M25631 Stiffness of right wrist, not elsewhere classified: Secondary | ICD-10-CM | POA: Diagnosis present

## 2019-07-23 DIAGNOSIS — R200 Anesthesia of skin: Secondary | ICD-10-CM | POA: Insufficient documentation

## 2019-07-23 DIAGNOSIS — R202 Paresthesia of skin: Secondary | ICD-10-CM | POA: Insufficient documentation

## 2019-07-23 NOTE — Patient Instructions (Signed)
Wrist splint most all the time - except ADL's and HEP  Contrast 2-3 x day  CT spreads by wife  Tendon glides  10 reps  And Med N glides  5 reps

## 2019-07-23 NOTE — Therapy (Signed)
[place] [facility] [address] [place], [address] Phone: [phone]   Fax:  [phone]  Occupational Therapy Treatment  Patient Details  Name: Louis Reyes MRN: [id number] Date of Birth: 1955/12/28 Referring Provider (OT): Dr Harlin Heys   Encounter Date: 07/23/2019  OT End of Session - 07/23/19 1127    Visit Number  8    Number of Visits  15    Date for OT Re-Evaluation  08/20/19    Authorization Type  workers comp    Authorization - Visit Number  8    Authorization - Number of Visits  15    OT Start Time  1100    OT Stop Time  1145    OT Time Calculation (min)  45 min    Activity Tolerance  Patient tolerated treatment well    Behavior During Therapy  [facility] for tasks assessed/performed       Past Medical History:  Diagnosis Date  . Family history of adverse reaction to anesthesia    Father  . History of kidney stones   . Hypertension   . Pre-diabetes    diet controlled    Past Surgical History:  Procedure Laterality Date  . COLONOSCOPY WITH PROPOFOL N/A 02/12/2018   Procedure: COLONOSCOPY WITH PROPOFOL;  Surgeon: Toledo, Benay Pike, MD;  Location: [facility] ENDOSCOPY;  Service: Gastroenterology;  Laterality: N/A;  . HERNIA REPAIR  04/29/2017  . INGUINAL HERNIA REPAIR N/A 04/29/2017   Procedure: HERNIA REPAIR INGUINAL ADULT;  Surgeon: Herbert Pun, MD;  Location: [facility] ORS;  Service: General;  Laterality: N/A;    There were no vitals filed for this visit.  Subjective Assessment - 07/23/19 1125    Subjective   Seen the Dr 26th Febr - got another shot - still some numbness in tip of thumb , index -and whole middle finger- some pain with small pinches on middle finger    Pertinent History  Pt injury occur on 04/30/2019 - arm was pulled into machine and sqeeze - resulting in pain in R hand to shoulder/upper traps , numbness thumb thru 4th digit- swelling - xrays was negative at ER on 11/5 -  seen ortho on 11/17 - refer to therapy    Patient Stated Goals  Want the pain, numbness in my R arm better so I can use my hand in my every day activiities and do my job    Currently in Pain?  Yes    Pain Score  4     Pain Location  Hand    Pain Orientation  Right    Pain Descriptors / Indicators  Tightness;Sore    Pain Type  Acute pain    Pain Onset  More than a month ago    Aggravating Factors   Pinching small objects - PIP of 3rd diigt         [facility] OT Assessment - 07/23/19 0001      Strength   Right Hand Grip (lbs)  66    Right Hand Lateral Pinch  20 lbs    Right Hand 3 Point Pinch  17 lbs    Left Hand Grip (lbs)  84    Left Hand Lateral Pinch  23 lbs    Left Hand 3 Point Pinch  20 lbs       pt return after not seen for month - wife had COVID  Pt had shot 2 days ago CT on R  Cont to have numbness  in tip of thumb and 2nd digit  numbness from [facility] whole 3rd digit and some pain in PIP with pinching grip per pt - smaller objects Grip and prehension strength about the same   Pt fitted with new short wrist splint to wear for 3 wks most all time time - off for ADL's and HEP       OT Treatments/Exercises (OP) - 07/23/19 0001      RUE Contrast Bath   Time  9 minutes    Comments  contrast prior to soft tissue        Graston tool nr 2 for brushing and sweeping over volar wrist and forearm And MC And Carpal spreads prior to AROM- tender over palm and 3rd digit   Tendon glidesdone- AROM pain free and do not force - 10 reps\ Opposition to all digits 5 reps - no pain  Med N glide review - only 5 reps 2 x day carpal spreads wife to do at home    No increase numbness with HEP  No pressure thru palm or grippingmore than 5 lbs    HEP:  Wrist splint most all the time - except ADL's and HEP  Contrast 2-3 x day  CT spreads by wife  Tendon glides  10 reps  And Med N glides  5 reps    OT Education - 07/23/19 1127    Education Details  HEP changes     Person(s) Educated  Patient    Methods  Explanation;Demonstration;Tactile cues;Verbal cues;Handout    Comprehension  Verbalized understanding;Returned demonstration       OT Short Term Goals - 07/23/19 1237      OT SHORT TERM GOAL #1   Title  Pain on PRWHE improve with more than 20 points    Baseline  pain on PRWHE with eval 31/50- improve - will assess next time    Time  3    Period  Weeks    Status  On-going    Target Date  08/13/19      OT SHORT TERM GOAL #2   Title  Pt to be independent in HEP to decrease pain , numbness and increase ROM /strength    Baseline  cont to have some numbness and pain    Time  3    Period  Weeks    Status  On-going    Target Date  08/13/19        OT Long Term Goals - 07/23/19 1237      OT LONG TERM GOAL #1   Title  R wrist and digits AROM in functional tasks increase of symptoms less than 2/10    Baseline  pain at rest volar wrist and forearm 6/10 , increase to 9/10 ; numbness increase during day - NOW pain not more than 4/10 in 3rd PIP with pinches    Time  4    Period  Weeks    Status  On-going    Target Date  08/20/19      OT LONG TERM GOAL #2   Title  R upper trap tightness and pain improve for pt to show cervical AROM WNL with pain less than 2/10    Status  Achieved      OT LONG TERM GOAL #3   Title  R grip and prehension strength improve to more than 50% without increase symptoms to use hand with more ease in pushing up , carry more than 5 lbs , cut food  Baseline  Grip R 30 ,L 80 ; lat grip R 8; L 19 lbs , 3 point R 5, L 16 lbs - and functional use 28% - NOW grip R increase to 60 lbs - still limit to 50lbs, pressure on CT    Time  4    Period  Weeks    Status  On-going    Target Date  08/20/19      OT LONG TERM GOAL #4   Title  Function score on PRWHE improve with more than 20 points    Baseline  Function score on PRWHE 37/50 at eval - to be done next visit    Time  4    Period  Weeks    Status  On-going    Target Date   08/20/19            Plan - 07/23/19 1129    Clinical Impression Statement  Pt about 12 wks out from crushing injury to R arm -hand got pulled into machine and sqeezed - pt was not seen for month - wife had COVID - pt return this date - seen MD 2 days ago -and got another shot in CT - pt cont to have numbness in tip of thumb and 2nd digit - and from palm the whole middle finger - pt positvie Tinel and phalens in L hand - grip and prehension strength same as monht ago - pt to cont contrast and HEP - but fitted with short wrist splint to wear for 3 wks most all the time    OT Occupational Profile and History  Problem Focused Assessment - Including review of records relating to presenting problem    Occupational performance deficits (Please refer to evaluation for details):  ADL's;IADL's;Work;Play;Leisure;Social Participation    Body Structure / Function / Physical Skills  ADL;Decreased knowledge of precautions;Flexibility;ROM;UE functional use;FMC;Muscle spasms;Sensation;Edema;Pain;Strength;IADL    Rehab Potential  Good    Clinical Decision Making  Several treatment options, min-mod task modification necessary    Comorbidities Affecting Occupational Performance:  None    Modification or Assistance to Complete Evaluation   No modification of tasks or assist necessary to complete eval    OT Frequency  2x / week    OT Duration  4 weeks    OT Treatment/Interventions  Self-care/ADL training;Therapeutic exercise;Patient/family education;Splinting;Paraffin;Fluidtherapy;Contrast Bath;Ultrasound;Manual Therapy;Passive range of motion    Plan  assess progress with HEP and modify as needed    OT Home Exercise Plan  see pt instruction    Consulted and Agree with Plan of Care  Patient       Patient will benefit from skilled therapeutic intervention in order to improve the following deficits and impairments:   Body Structure / Function / Physical Skills: ADL, Decreased knowledge of precautions,  Flexibility, ROM, UE functional use, FMC, Muscle spasms, Sensation, Edema, Pain, Strength, IADL       Visit Diagnosis: Pain in right arm - Plan: Ot plan of care cert/re-cert  Stiffness of right hand, not elsewhere classified - Plan: Ot plan of care cert/re-cert  Stiffness of right wrist, not elsewhere classified - Plan: Ot plan of care cert/re-cert  Muscle weakness (generalized) - Plan: Ot plan of care cert/re-cert  Numbness and tingling in right hand - Plan: Ot plan of care cert/re-cert  Cervicalgia - Plan: Ot plan of care cert/re-cert    Problem List There are no problems to display for this patient.   Rosalyn Gess OTR/L,CLT 07/23/2019, 12:41 PM  [facility]  [facility] [address], [place], [address] Phone: [phone]   Fax:  [phone]  Name: Sladen Hurd MRN: [id number] Date of Birth: May 06, 1956

## 2019-07-29 ENCOUNTER — Ambulatory Visit: Payer: Worker's Compensation | Attending: Orthopaedic Surgery | Admitting: Occupational Therapy

## 2019-07-29 ENCOUNTER — Other Ambulatory Visit: Payer: Self-pay

## 2019-07-29 DIAGNOSIS — M542 Cervicalgia: Secondary | ICD-10-CM | POA: Insufficient documentation

## 2019-07-29 DIAGNOSIS — M79601 Pain in right arm: Secondary | ICD-10-CM | POA: Diagnosis present

## 2019-07-29 DIAGNOSIS — M25641 Stiffness of right hand, not elsewhere classified: Secondary | ICD-10-CM | POA: Insufficient documentation

## 2019-07-29 DIAGNOSIS — R200 Anesthesia of skin: Secondary | ICD-10-CM | POA: Insufficient documentation

## 2019-07-29 DIAGNOSIS — M6281 Muscle weakness (generalized): Secondary | ICD-10-CM | POA: Diagnosis present

## 2019-07-29 DIAGNOSIS — R202 Paresthesia of skin: Secondary | ICD-10-CM | POA: Insufficient documentation

## 2019-07-29 DIAGNOSIS — M25631 Stiffness of right wrist, not elsewhere classified: Secondary | ICD-10-CM | POA: Insufficient documentation

## 2019-07-29 NOTE — Patient Instructions (Signed)
Same but add combination  Nerve glide  - pull in volar elbow, forearm to palm and 3rd digit

## 2019-07-29 NOTE — Therapy (Signed)
[facility] [address] [place], [address] Phone: [phone]   Fax:  [phone]  Occupational Therapy Treatment  Patient Details  Name: Louis Reyes MRN: [id number] Date of Birth: [birth year] Referring Provider (OT): Dr Harlin Heys   Encounter Date: 07/29/2019  OT End of Session - 07/29/19 1709    Visit Number  9    Number of Visits  15    Date for OT Re-Evaluation  08/20/19    Authorization - Visit Number  9    Authorization - Number of Visits  15    OT Start Time  [id number]    OT Stop Time  1610    OT Time Calculation (min)  35 min       Past Medical History:  Diagnosis Date  . Family history of adverse reaction to anesthesia    Father  . History of kidney stones   . Hypertension   . Pre-diabetes    diet controlled    Past Surgical History:  Procedure Laterality Date  . COLONOSCOPY WITH PROPOFOL N/A 02/12/2018   Procedure: COLONOSCOPY WITH PROPOFOL;  Surgeon: Toledo, Benay Pike, MD;  Location: [facility];  Service: Gastroenterology;  Laterality: N/A;  . HERNIA REPAIR  04/29/2017  . INGUINAL HERNIA REPAIR N/A 04/29/2017   Procedure: HERNIA REPAIR INGUINAL ADULT;  Surgeon: Herbert Pun, MD;  Location: [facility];  Service: General;  Laterality: N/A;    There were no vitals filed for this visit.  Subjective Assessment - 07/29/19 1546    Subjective   The numbness in my thumb and index finger are better - but middle finger still the same    Pertinent History  Pt injury occur on 04/30/2019 - arm was pulled into machine and sqeeze - resulting in pain in R hand to shoulder/upper traps , numbness thumb thru 4th digit- swelling - xrays was negative at ER on 11/5 - seen ortho on 11/17 - refer to therapy    Patient Stated Goals  Want the pain, numbness in my R arm better so I can use my hand in my every day activiities and do my job    Currently in Pain?  No/denies         Pt arrive  this date with reports since last week  Numbness improved  in tip of thumb and 2nd digit But  numbness from [facility] whole 3rd digit still same and some pain in PIP with pinching grip per pt - smaller objects   Pt to cont wearing short wrist splint most all time time - off for ADL's and HEP             OT Treatments/Exercises (OP) - 07/29/19 0001      Ultrasound   Ultrasound Location  L volar wrist     Ultrasound Parameters  3.3 MHZ, 20%, 1.0 intenstiy     Ultrasound Goals  Edema      RUE Contrast Bath   Time  9 minutes    Comments  contrast prior to soft tissue        Graston tool nr 2 for brushing and sweeping over volar wrist and forearm And MC And Carpal spreads prior to AROM- tender over palm and 3rd digit Tendon glidesdone- AROM pain free and do not force - 10 reps\ Opposition to all digits 5 reps - no pain  Med N glide review - only 5 reps 2 x day carpal spreads wife to do at  home  Did add composite nerve glide - 5 reps slight pull volar elbow down to middle finger -   No increase numbness with HEP  No pressure thru palm or grippingmore than 5 lbs  HEP:  Wrist splint most all the time - except ADL's and HEP  Contrast 2-3 x day  CT spreads by wife  Tendon glides  10 reps  And Med N glides  5 reps        OT Education - 07/29/19 1553    Education Details  HEP    Person(s) Educated  Patient    Methods  Explanation;Demonstration;Tactile cues;Verbal cues;Handout    Comprehension  Verbalized understanding;Returned demonstration       OT Short Term Goals - 07/23/19 1237      OT SHORT TERM GOAL #1   Title  Pain on PRWHE improve with more than 20 points    Baseline  pain on PRWHE with eval 31/50- improve - will assess next time    Time  3    Period  Weeks    Status  On-going    Target Date  08/13/19      OT SHORT TERM GOAL #2   Title  Pt to be independent in HEP to decrease pain , numbness and increase ROM /strength    Baseline  cont to  have some numbness and pain    Time  3    Period  Weeks    Status  On-going    Target Date  08/13/19        OT Long Term Goals - 07/23/19 1237      OT LONG TERM GOAL #1   Title  R wrist and digits AROM in functional tasks increase of symptoms less than 2/10    Baseline  pain at rest volar wrist and forearm 6/10 , increase to 9/10 ; numbness increase during day - NOW pain not more than 4/10 in 3rd PIP with pinches    Time  4    Period  Weeks    Status  On-going    Target Date  08/20/19      OT LONG TERM GOAL #2   Title  R upper trap tightness and pain improve for pt to show cervical AROM WNL with pain less than 2/10    Status  Achieved      OT LONG TERM GOAL #3   Title  R grip and prehension strength improve to more than 50% without increase symptoms to use hand with more ease in pushing up , carry more than 5 lbs , cut food    Baseline  Grip R 30 ,L 80 ; lat grip R 8; L 19 lbs , 3 point R 5, L 16 lbs - and functional use 28% - NOW grip R increase to 60 lbs - still limit to 50lbs, pressure on CT    Time  4    Period  Weeks    Status  On-going    Target Date  08/20/19      OT LONG TERM GOAL #4   Title  Function score on PRWHE improve with more than 20 points    Baseline  Function score on PRWHE 37/50 at eval - to be done next visit    Time  4    Period  Weeks    Status  On-going    Target Date  08/20/19            Plan - 07/29/19 1710  Clinical Impression Statement  Pt is 13 wks about out from crushing injury to R arm , hand pulled into machinge and squeezed - pt do arrive this date with less numbness in thumb and 2nd digit per pt - but middle finger still the same- was using wrist splint the last week most all the time in combination with HEP    OT Occupational Profile and History  Problem Focused Assessment - Including review of records relating to presenting problem    Occupational performance deficits (Please refer to evaluation for details):   ADL's;IADL's;Work;Play;Leisure;Social Participation    Body Structure / Function / Physical Skills  ADL;Decreased knowledge of precautions;Flexibility;ROM;UE functional use;FMC;Muscle spasms;Sensation;Edema;Pain;Strength;IADL    Rehab Potential  Good    Clinical Decision Making  Several treatment options, min-mod task modification necessary    Comorbidities Affecting Occupational Performance:  None    Modification or Assistance to Complete Evaluation   No modification of tasks or assist necessary to complete eval    OT Frequency  2x / week    OT Duration  --   3 wks   OT Treatment/Interventions  Self-care/ADL training;Therapeutic exercise;Patient/family education;Splinting;Paraffin;Fluidtherapy;Contrast Bath;Ultrasound;Manual Therapy;Passive range of motion    Plan  assess progress with HEP and modify as needed    OT Home Exercise Plan  see pt instruction    Consulted and Agree with Plan of Care  Patient       Patient will benefit from skilled therapeutic intervention in order to improve the following deficits and impairments:   Body Structure / Function / Physical Skills: ADL, Decreased knowledge of precautions, Flexibility, ROM, UE functional use, FMC, Muscle spasms, Sensation, Edema, Pain, Strength, IADL       Visit Diagnosis: Pain in right arm  Stiffness of right hand, not elsewhere classified  Stiffness of right wrist, not elsewhere classified  Muscle weakness (generalized)  Numbness and tingling in right hand    Problem List There are no problems to display for this patient.   Rosalyn Gess OTR/L,CLT 07/29/2019, 5:12 PM  [facility] [address] [place], [address] Phone: [phone]   Fax:  [phone]  Name: Louis Reyes MRN: [id number] Date of Birth: 16-Dec-1955

## 2019-07-31 ENCOUNTER — Ambulatory Visit: Payer: Worker's Compensation | Admitting: Occupational Therapy

## 2019-07-31 ENCOUNTER — Other Ambulatory Visit: Payer: Self-pay

## 2019-07-31 DIAGNOSIS — R200 Anesthesia of skin: Secondary | ICD-10-CM

## 2019-07-31 DIAGNOSIS — M79601 Pain in right arm: Secondary | ICD-10-CM | POA: Diagnosis not present

## 2019-07-31 DIAGNOSIS — M6281 Muscle weakness (generalized): Secondary | ICD-10-CM

## 2019-07-31 DIAGNOSIS — M25631 Stiffness of right wrist, not elsewhere classified: Secondary | ICD-10-CM

## 2019-07-31 DIAGNOSIS — M25641 Stiffness of right hand, not elsewhere classified: Secondary | ICD-10-CM

## 2019-07-31 NOTE — Therapy (Signed)
[place] [facility] [address] [place], [address] Phone: [phone]   Fax:  [phone]  Occupational Therapy Treatment  Patient Details  Name: Louis Reyes MRN: [id number] Date of Birth: 25-Nov-1955 Referring Provider (OT): Dr Harlin Heys   Encounter Date: 07/31/2019  OT End of Session - 07/31/19 1101    Visit Number  10    Number of Visits  15    Date for OT Re-Evaluation  08/20/19    Authorization Type  workers comp    Authorization - Visit Number  10    Authorization - Number of Visits  15    OT Start Time  1102    OT Stop Time  1143    OT Time Calculation (min)  41 min    Activity Tolerance  Patient tolerated treatment well    Behavior During Therapy  [facility] for tasks assessed/performed       Past Medical History:  Diagnosis Date  . Family history of adverse reaction to anesthesia    Father  . History of kidney stones   . Hypertension   . Pre-diabetes    diet controlled    Past Surgical History:  Procedure Laterality Date  . COLONOSCOPY WITH PROPOFOL N/A 02/12/2018   Procedure: COLONOSCOPY WITH PROPOFOL;  Surgeon: Toledo, Benay Pike, MD;  Location: [facility];  Service: Gastroenterology;  Laterality: N/A;  . HERNIA REPAIR  04/29/2017  . INGUINAL HERNIA REPAIR N/A 04/29/2017   Procedure: HERNIA REPAIR INGUINAL ADULT;  Surgeon: Herbert Pun, MD;  Location: [facility];  Service: General;  Laterality: N/A;    There were no vitals filed for this visit.  Subjective Assessment - 07/31/19 1115    Subjective   That middle finger still numb - middle joint sore and hurt when pinching opening bottle - and feels stiff - index and thumb numbness is better    Pertinent History  Pt injury occur on 04/30/2019 - arm was pulled into machine and sqeeze - resulting in pain in R hand to shoulder/upper traps , numbness thumb thru 4th digit- swelling - xrays was negative at ER on 11/5 - seen ortho on  11/17 - refer to therapy    Patient Stated Goals  Want the pain, numbness in my R arm better so I can use my hand in my every day activiities and do my job    Currently in Pain?  Yes    Pain Score  6     Pain Location  Finger (Comment which one)    Pain Orientation  Right    Pain Descriptors / Indicators  Numbness;Tightness    Pain Type  Acute pain    Pain Onset  More than a month ago    Aggravating Factors   pinching small objects           Pt reports this week less numbness  in tip of thumb and 2nd digit But numbness from [facility] whole 3rd digit still same and some pain in PIP with pinching grip  And tight grip - increase to 6/10 per pt - feel swollen per pt  Fitted with digi compression sleeve for edema - night time and at work    Pt to cont wearing short wrist splint most all time time - off for ADL's and HEP           OT Treatments/Exercises (OP) - 07/31/19 0001      Ultrasound   Ultrasound Location  L  volar CT and PIP of 3td     Ultrasound Parameters  3.3MHZ, 20 % 1.0 intensity     Ultrasound Goals  Edema;Pain      RUE Contrast Bath   Time  9 minutes    Comments  contrast prior soft tissue         Graston tool nr 2 for brushing and sweeping over volar wrist and forearm And MC And Carpal spreads prior to AROM- tender over palm and 3rd digit Tendon glidesdone- AROM pain free and do not force especcially intrinsic fist with 3rd digit- keep pain 2/10 - 10 reps\ Opposition to all digits 5 reps - no pain  Med N glide review - only 5 reps 2 x day carpal spreads wifeto do at home Did add composite nerve glide - 5 reps slight pull volar elbow down to middle finger -   No increase numbness with HEP  No pressure thru palm or grippingmore than 5 lbs        OT Education - 07/31/19 1101    Education Details  HEP    Person(s) Educated  Patient    Methods  Explanation;Demonstration;Tactile cues;Verbal cues;Handout    Comprehension  Verbalized  understanding;Returned demonstration       OT Short Term Goals - 07/23/19 1237      OT SHORT TERM GOAL #1   Title  Pain on PRWHE improve with more than 20 points    Baseline  pain on PRWHE with eval 31/50- improve - will assess next time    Time  3    Period  Weeks    Status  On-going    Target Date  08/13/19      OT SHORT TERM GOAL #2   Title  Pt to be independent in HEP to decrease pain , numbness and increase ROM /strength    Baseline  cont to have some numbness and pain    Time  3    Period  Weeks    Status  On-going    Target Date  08/13/19        OT Long Term Goals - 07/23/19 1237      OT LONG TERM GOAL #1   Title  R wrist and digits AROM in functional tasks increase of symptoms less than 2/10    Baseline  pain at rest volar wrist and forearm 6/10 , increase to 9/10 ; numbness increase during day - NOW pain not more than 4/10 in 3rd PIP with pinches    Time  4    Period  Weeks    Status  On-going    Target Date  08/20/19      OT LONG TERM GOAL #2   Title  R upper trap tightness and pain improve for pt to show cervical AROM WNL with pain less than 2/10    Status  Achieved      OT LONG TERM GOAL #3   Title  R grip and prehension strength improve to more than 50% without increase symptoms to use hand with more ease in pushing up , carry more than 5 lbs , cut food    Baseline  Grip R 30 ,L 80 ; lat grip R 8; L 19 lbs , 3 point R 5, L 16 lbs - and functional use 28% - NOW grip R increase to 60 lbs - still limit to 50lbs, pressure on CT    Time  4    Period  Weeks  Status  On-going    Target Date  08/20/19      OT LONG TERM GOAL #4   Title  Function score on PRWHE improve with more than 20 points    Baseline  Function score on PRWHE 37/50 at eval - to be done next visit    Time  4    Period  Weeks    Status  On-going    Target Date  08/20/19            Plan - 07/31/19 1102    Clinical Impression Statement  Pt is about 13 1/2 wks out from crushing  injury to R arm , hand pulled in machine and squeezed - pt report this week his numbness in 2nd and thumb better - but cont to have numbness of 3rd digit with  positive Tinel in palm  - report since returning to therapy last week - increase discomfort and pain in PIP of 3rd with pinch grip and tight fist , increase edema feeling per pt -provided him compression sleeve for 3rd digit    OT Occupational Profile and History  Problem Focused Assessment - Including review of records relating to presenting problem    Occupational performance deficits (Please refer to evaluation for details):  ADL's;IADL's;Work;Play;Leisure;Social Participation    Body Structure / Function / Physical Skills  ADL;Decreased knowledge of precautions;Flexibility;ROM;UE functional use;FMC;Muscle spasms;Sensation;Edema;Pain;Strength;IADL    Rehab Potential  Good    Clinical Decision Making  Several treatment options, min-mod task modification necessary    Comorbidities Affecting Occupational Performance:  None    Modification or Assistance to Complete Evaluation   No modification of tasks or assist necessary to complete eval    OT Frequency  2x / week    OT Duration  2 weeks    OT Treatment/Interventions  Self-care/ADL training;Therapeutic exercise;Patient/family education;Splinting;Paraffin;Fluidtherapy;Contrast Bath;Ultrasound;Manual Therapy;Passive range of motion    Plan  assess progress with HEP and modify as needed    OT Home Exercise Plan  see pt instruction       Patient will benefit from skilled therapeutic intervention in order to improve the following deficits and impairments:   Body Structure / Function / Physical Skills: ADL, Decreased knowledge of precautions, Flexibility, ROM, UE functional use, FMC, Muscle spasms, Sensation, Edema, Pain, Strength, IADL       Visit Diagnosis: Pain in right arm  Stiffness of right hand, not elsewhere classified  Stiffness of right wrist, not elsewhere classified  Muscle  weakness (generalized)  Numbness and tingling in right hand    Problem List There are no problems to display for this patient.   Rosalyn Gess  OTR/L,CLT 07/31/2019, 1:34 PM  [place] [facility] [address], [place], [address] Phone: [phone]   Fax:  [phone]  Name: Louis Reyes MRN: [id number] Date of Birth: 08/07/1955

## 2019-07-31 NOTE — Patient Instructions (Signed)
Add this date compression sleeve for 2nd 3rd digit - for compression for PIP night time and at work  And when doing tendon glides- intrinsic fist stretch need to be to 2/10

## 2019-08-04 ENCOUNTER — Ambulatory Visit: Payer: Worker's Compensation | Admitting: Occupational Therapy

## 2019-08-04 ENCOUNTER — Other Ambulatory Visit: Payer: Self-pay

## 2019-08-04 DIAGNOSIS — M6281 Muscle weakness (generalized): Secondary | ICD-10-CM

## 2019-08-04 DIAGNOSIS — R202 Paresthesia of skin: Secondary | ICD-10-CM

## 2019-08-04 DIAGNOSIS — M79601 Pain in right arm: Secondary | ICD-10-CM | POA: Diagnosis not present

## 2019-08-04 DIAGNOSIS — M25641 Stiffness of right hand, not elsewhere classified: Secondary | ICD-10-CM

## 2019-08-04 DIAGNOSIS — R200 Anesthesia of skin: Secondary | ICD-10-CM

## 2019-08-04 DIAGNOSIS — M25631 Stiffness of right wrist, not elsewhere classified: Secondary | ICD-10-CM

## 2019-08-04 DIAGNOSIS — M542 Cervicalgia: Secondary | ICD-10-CM

## 2019-08-04 NOTE — Therapy (Signed)
[place] [facility] [address] [place], [address] Phone: [phone]   Fax:  [phone]  Occupational Therapy Treatment  Patient Details  Name: Louis Reyes MRN: [id number] Date of Birth: 1955/09/09 Referring Provider (OT): Dr Harlin Heys   Encounter Date: 08/04/2019  OT End of Session - 08/04/19 1555    Visit Number  11    Number of Visits  15    Date for OT Re-Evaluation  08/20/19    Authorization Type  workers comp    Authorization - Visit Number  11    Authorization - Number of Visits  15    OT Start Time  1541    OT Stop Time  1617    OT Time Calculation (min)  36 min    Activity Tolerance  Patient tolerated treatment well    Behavior During Therapy  [facility] for tasks assessed/performed       Past Medical History:  Diagnosis Date  . Family history of adverse reaction to anesthesia    Father  . History of kidney stones   . Hypertension   . Pre-diabetes    diet controlled    Past Surgical History:  Procedure Laterality Date  . COLONOSCOPY WITH PROPOFOL N/A 02/12/2018   Procedure: COLONOSCOPY WITH PROPOFOL;  Surgeon: Toledo, Benay Pike, MD;  Location: [facility];  Service: Gastroenterology;  Laterality: N/A;  . HERNIA REPAIR  04/29/2017  . INGUINAL HERNIA REPAIR N/A 04/29/2017   Procedure: HERNIA REPAIR INGUINAL ADULT;  Surgeon: Herbert Pun, MD;  Location: [facility];  Service: General;  Laterality: N/A;    There were no vitals filed for this visit.  Subjective Assessment - 08/04/19 1549    Subjective   My hand is always better in the am - middle finger numbness still the worse - but when you tap now from palm up not wrist -and 2nd digit little numb at the end of day    Pertinent History  Pt injury occur on 04/30/2019 - arm was pulled into machine and sqeeze - resulting in pain in R hand to shoulder/upper traps , numbness thumb thru 4th digit- swelling - xrays was negative at ER  on 11/5 - seen ortho on 11/17 - refer to therapy    Patient Stated Goals  Want the pain, numbness in my R arm better so I can use my hand in my every day activiities and do my job    Currently in Pain?  Yes    Pain Score  4     Pain Location  Finger (Comment which one)    Pain Orientation  Right    Pain Descriptors / Indicators  Numbness;Tightness    Pain Type  Acute pain    Pain Onset  More than a month ago                   OT Treatments/Exercises (OP) - 08/04/19 0001      Ultrasound   Ultrasound Location  L volar CT and 3rd digit     Ultrasound Parameters  3.3MHZ , 20 % , 1.0 intenstiy     Ultrasound Goals  Edema;Pain      RUE Contrast Bath   Time  9 minutes    Comments  contrast prior to soft tissue         Graston tool nr 2 for brushing and sweeping over volar wrist and forearm And MC And Carpal spreads prior to AROM- tender over  palm and 3rd digit Tinel now in palm and not at CT for 3rd digit  2nd digit tip with some numbness at the end of day after working - am good   Tendon glidesdone- AROM pain free and do not force especcially intrinsic fist with 3rd digit- keep pain 2/10 - 10 reps Opposition to all digits 5 reps - no pain  Med N glide review - only 5 reps 2 x day carpal spreads wifeto do at home Did add composite nerve glide - 5 reps slight pull volar elbow down to middle finger -   No increase numbness with HEP  No pressure thru palm or grippingmore than 5 lbs       OT Education - 08/04/19 1555    Education Details  progress, nerve healing and HEP    Person(s) Educated  Patient    Methods  Explanation;Demonstration;Tactile cues;Verbal cues;Handout    Comprehension  Verbalized understanding;Returned demonstration       OT Short Term Goals - 07/23/19 1237      OT SHORT TERM GOAL #1   Title  Pain on PRWHE improve with more than 20 points    Baseline  pain on PRWHE with eval 31/50- improve - will assess next time    Time  3     Period  Weeks    Status  On-going    Target Date  08/13/19      OT SHORT TERM GOAL #2   Title  Pt to be independent in HEP to decrease pain , numbness and increase ROM /strength    Baseline  cont to have some numbness and pain    Time  3    Period  Weeks    Status  On-going    Target Date  08/13/19        OT Long Term Goals - 07/23/19 1237      OT LONG TERM GOAL #1   Title  R wrist and digits AROM in functional tasks increase of symptoms less than 2/10    Baseline  pain at rest volar wrist and forearm 6/10 , increase to 9/10 ; numbness increase during day - NOW pain not more than 4/10 in 3rd PIP with pinches    Time  4    Period  Weeks    Status  On-going    Target Date  08/20/19      OT LONG TERM GOAL #2   Title  R upper trap tightness and pain improve for pt to show cervical AROM WNL with pain less than 2/10    Status  Achieved      OT LONG TERM GOAL #3   Title  R grip and prehension strength improve to more than 50% without increase symptoms to use hand with more ease in pushing up , carry more than 5 lbs , cut food    Baseline  Grip R 30 ,L 80 ; lat grip R 8; L 19 lbs , 3 point R 5, L 16 lbs - and functional use 28% - NOW grip R increase to 60 lbs - still limit to 50lbs, pressure on CT    Time  4    Period  Weeks    Status  On-going    Target Date  08/20/19      OT LONG TERM GOAL #4   Title  Function score on PRWHE improve with more than 20 points    Baseline  Function score on PRWHE 37/50 at eval -  to be done next visit    Time  4    Period  Weeks    Status  On-going    Target Date  08/20/19            Plan - 08/04/19 1556    Clinical Impression Statement  Pt is 14 wks out from crushing injury toR arm -- hand pulled in machine , and squeezed - pt cont to have numbness in 3rd -but tinel now in palm and not in wrist. Pt report numbness end of day in tip of 2nd digit if using hand - but no numbness in 4th or thumb - did show some progress since last shot and  using wrist splint - pt to see MD Thursday    OT Occupational Profile and History  Problem Focused Assessment - Including review of records relating to presenting problem    Occupational performance deficits (Please refer to evaluation for details):  ADL's;IADL's;Work;Play;Leisure;Social Participation    Body Structure / Function / Physical Skills  ADL;Decreased knowledge of precautions;Flexibility;ROM;UE functional use;FMC;Muscle spasms;Sensation;Edema;Pain;Strength;IADL    Rehab Potential  Good    Clinical Decision Making  Several treatment options, min-mod task modification necessary    Comorbidities Affecting Occupational Performance:  None    Modification or Assistance to Complete Evaluation   No modification of tasks or assist necessary to complete eval    OT Frequency  2x / week    OT Duration  2 weeks    OT Treatment/Interventions  Self-care/ADL training;Therapeutic exercise;Patient/family education;Splinting;Paraffin;Fluidtherapy;Contrast Bath;Ultrasound;Manual Therapy;Passive range of motion    Plan  assess progress with HEP and modify as needed    OT Home Exercise Plan  see pt instruction    Consulted and Agree with Plan of Care  Patient       Patient will benefit from skilled therapeutic intervention in order to improve the following deficits and impairments:   Body Structure / Function / Physical Skills: ADL, Decreased knowledge of precautions, Flexibility, ROM, UE functional use, FMC, Muscle spasms, Sensation, Edema, Pain, Strength, IADL       Visit Diagnosis: Pain in right arm  Stiffness of right hand, not elsewhere classified  Stiffness of right wrist, not elsewhere classified  Muscle weakness (generalized)  Numbness and tingling in right hand  Cervicalgia    Problem List There are no problems to display for this patient.   Rosalyn Gess OTR/l,CLT 08/04/2019, 5:49 PM  [place] [facility] [address] [place], [address] Phone: [phone]   Fax:  [phone]  Name: Demtrius Entsminger MRN: [id number] Date of Birth: 05/02/1956

## 2019-08-04 NOTE — Patient Instructions (Signed)
same

## 2019-09-13 ENCOUNTER — Ambulatory Visit: Payer: BC Managed Care – PPO | Attending: Internal Medicine

## 2019-09-13 DIAGNOSIS — Z23 Encounter for immunization: Secondary | ICD-10-CM

## 2019-09-13 NOTE — Progress Notes (Signed)
   Cov[facility] Vaccination [facility]  Name:  Louis Reyes    MRN: [id number] DOB: March 10, 1956  09/13/2019  Mr. Louis Reyes was observed post Covid-19 immunization for 15 minutes without incident. He was provided with Vaccine Information Sheet and instruction to access the V-Safe system.   Mr. Louis Reyes was instructed to call 911 with any severe reactions post vaccine: Marland Kitchen Difficulty breathing  . Swelling of face and throat  . A fast heartbeat  . A bad rash all over body  . Dizziness and weakness   Immunizations Administered    Name Date Dose VIS Date Route   Pfizer COVID-19 Vaccine 09/13/2019  6:33 PM 0.3 mL 06/05/2019 Intramuscular   Manufacturer: [place]   Lot: [id number]   [place]: [id number]

## 2019-10-04 ENCOUNTER — Ambulatory Visit: Payer: BC Managed Care – PPO | Attending: Internal Medicine

## 2019-10-04 DIAGNOSIS — Z23 Encounter for immunization: Secondary | ICD-10-CM

## 2019-10-04 NOTE — Progress Notes (Addendum)
   Covid-19 Vaccination Clinic  Name:  Louis Reyes    MRN: [id number] DOB: January 24, 1956  10/04/2019  Mr. Louis Reyes was observed post Covid-19 immunization for 30 minutes based on pre-vaccination screening without incident. He was provided with Vaccine Information Sheet and instruction to access the V-Safe system.   Mr. Louis Reyes was instructed to call 911 with any severe reactions post vaccine: Marland Kitchen Difficulty breathing  . Swelling of face and throat  . A fast heartbeat  . A bad rash all over body  . Dizziness and weakness   Medical Interpreter used during encounter.  Immunizations Administered    Name Date Dose VIS Date Route   Pfizer COVID-19 Vaccine 10/04/2019  4:49 PM 0.3 mL 06/05/2019 Intramuscular   Manufacturer: [place]   Lot: [id number]   [place]: [id number]

## 2021-05-13 IMAGING — DX DG FOREARM 2V*R*
2 series · 2 of 2 positions shown · non-contrast
Comparison: None.

CLINICAL DATA: Work injury.  Pain from right shoulder to wrist.

EXAM:
RIGHT FOREARM - 2 VIEW

[forearm ap]
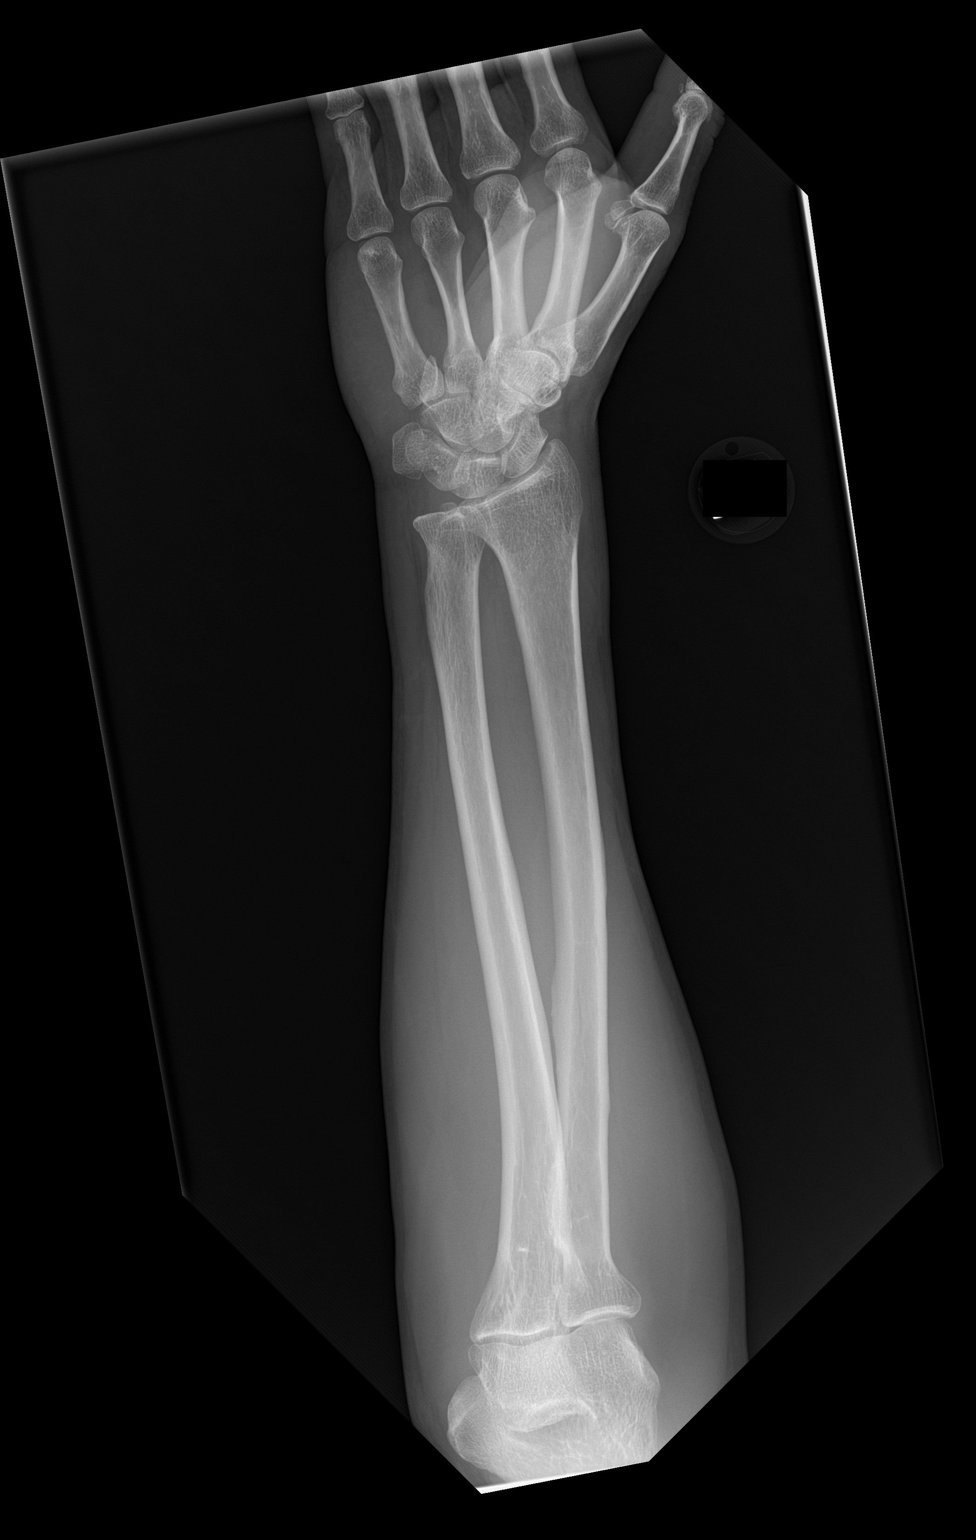

[forearm lat]
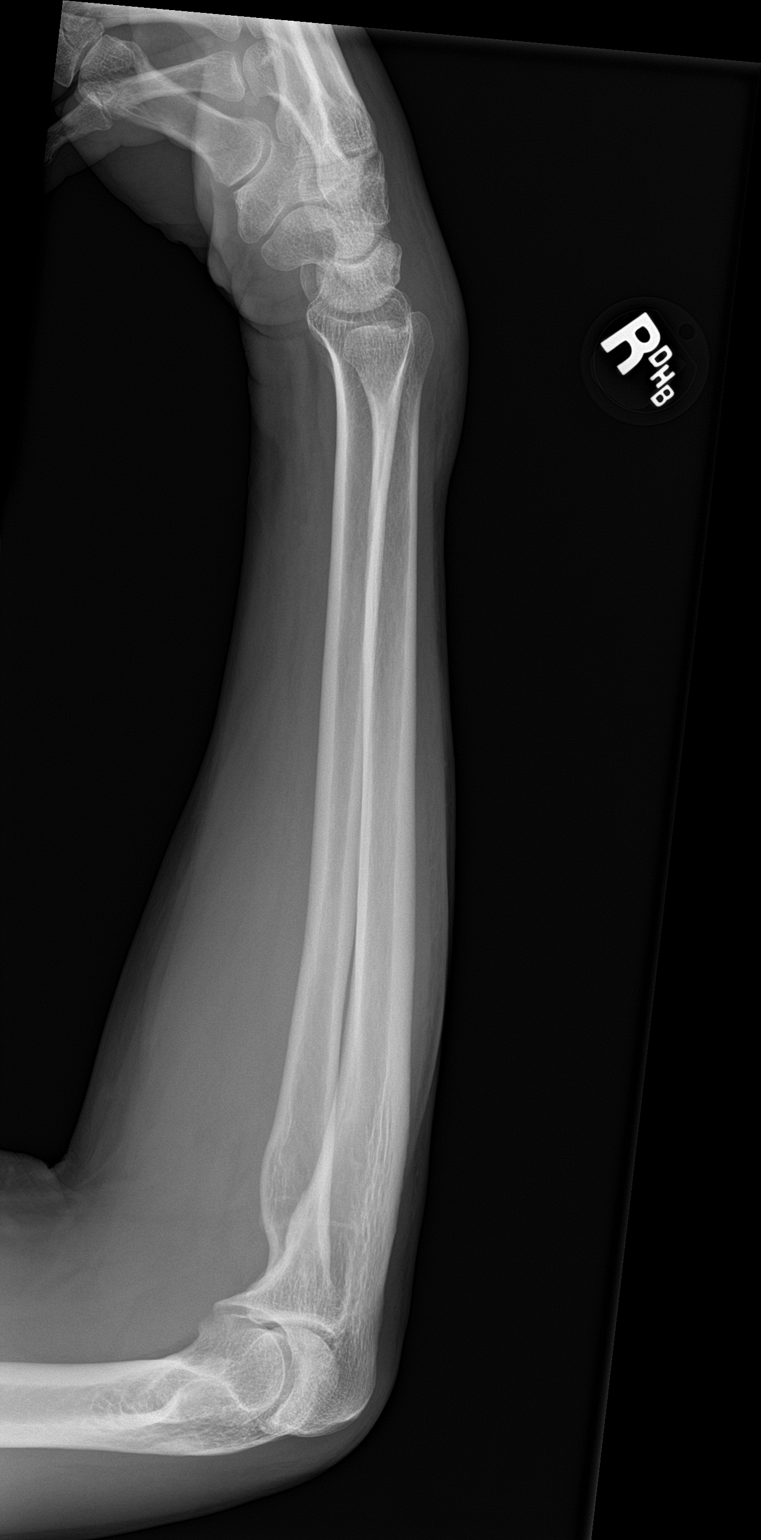

[2 of 2 positions shown; findings below may reference images not displayed]

FINDINGS: Posterior soft tissue swelling over the wrist. No acute bony
abnormality. Specifically, no fracture, subluxation, or dislocation.
IMPRESSION: No acute bony abnormality.

## 2021-07-26 ENCOUNTER — Other Ambulatory Visit: Payer: Self-pay | Admitting: Specialist

## 2021-07-26 DIAGNOSIS — R918 Other nonspecific abnormal finding of lung field: Secondary | ICD-10-CM

## 2021-08-07 ENCOUNTER — Ambulatory Visit: Payer: BC Managed Care – PPO

## 2021-08-17 ENCOUNTER — Ambulatory Visit: Payer: BC Managed Care – PPO | Attending: Specialist

## 2022-01-22 ENCOUNTER — Other Ambulatory Visit: Payer: Self-pay | Admitting: Specialist

## 2022-01-22 DIAGNOSIS — R0602 Shortness of breath: Secondary | ICD-10-CM

## 2022-01-22 DIAGNOSIS — J986 Disorders of diaphragm: Secondary | ICD-10-CM

## 2022-01-31 ENCOUNTER — Other Ambulatory Visit (HOSPITAL_COMMUNITY): Payer: BC Managed Care – PPO

## 2022-01-31 ENCOUNTER — Ambulatory Visit
Admission: RE | Admit: 2022-01-31 | Discharge: 2022-01-31 | Disposition: A | Discharge status: Home / Self Care | Payer: BC Managed Care – PPO | Source: Ambulatory Visit | Attending: Specialist | Admitting: Specialist

## 2022-01-31 DIAGNOSIS — J986 Disorders of diaphragm: Secondary | ICD-10-CM | POA: Diagnosis present

## 2022-01-31 DIAGNOSIS — R0602 Shortness of breath: Secondary | ICD-10-CM | POA: Insufficient documentation

## 2022-02-15 ENCOUNTER — Other Ambulatory Visit: Payer: Self-pay | Admitting: Specialist

## 2022-02-15 DIAGNOSIS — R911 Solitary pulmonary nodule: Secondary | ICD-10-CM

## 2022-02-21 ENCOUNTER — Other Ambulatory Visit: Payer: BC Managed Care – PPO

## 2022-02-23 ENCOUNTER — Ambulatory Visit
Admission: RE | Admit: 2022-02-23 | Discharge: 2022-02-23 | Disposition: A | Discharge status: Home / Self Care | Payer: BC Managed Care – PPO | Source: Ambulatory Visit | Attending: Specialist | Admitting: Specialist

## 2022-02-23 DIAGNOSIS — R911 Solitary pulmonary nodule: Secondary | ICD-10-CM

## 2022-07-06 ENCOUNTER — Other Ambulatory Visit: Payer: Self-pay | Admitting: Specialist

## 2022-07-06 DIAGNOSIS — J986 Disorders of diaphragm: Secondary | ICD-10-CM

## 2022-07-06 DIAGNOSIS — R918 Other nonspecific abnormal finding of lung field: Secondary | ICD-10-CM

## 2022-07-19 ENCOUNTER — Ambulatory Visit
Admission: RE | Admit: 2022-07-19 | Discharge: 2022-07-19 | Disposition: A | Discharge status: Home / Self Care | Payer: BC Managed Care – PPO | Source: Ambulatory Visit | Attending: Specialist | Admitting: Specialist

## 2022-07-19 DIAGNOSIS — J986 Disorders of diaphragm: Secondary | ICD-10-CM

## 2022-07-19 DIAGNOSIS — R918 Other nonspecific abnormal finding of lung field: Secondary | ICD-10-CM

## 2023-03-05 ENCOUNTER — Other Ambulatory Visit: Payer: Self-pay | Admitting: Specialist

## 2023-03-05 DIAGNOSIS — R0602 Shortness of breath: Secondary | ICD-10-CM

## 2023-03-05 DIAGNOSIS — R918 Other nonspecific abnormal finding of lung field: Secondary | ICD-10-CM

## 2023-03-11 ENCOUNTER — Ambulatory Visit
Admission: RE | Admit: 2023-03-11 | Discharge: 2023-03-11 | Disposition: A | Discharge status: Home / Self Care | Payer: BC Managed Care – PPO | Source: Ambulatory Visit | Attending: Specialist | Admitting: Specialist

## 2023-03-11 DIAGNOSIS — R918 Other nonspecific abnormal finding of lung field: Secondary | ICD-10-CM | POA: Insufficient documentation

## 2023-03-11 DIAGNOSIS — R0602 Shortness of breath: Secondary | ICD-10-CM | POA: Diagnosis present
